# Patient Record
Sex: Female | Born: 1965 | Race: Black or African American | Hispanic: No | Marital: Married | Smoking: Never smoker
Health system: Southern US, Community
[De-identification: ages and names within clinical notes are randomized; demographics above are authoritative.]

## PROBLEM LIST (undated history)

## (undated) DIAGNOSIS — R519 Headache, unspecified: Secondary | ICD-10-CM

## (undated) DIAGNOSIS — K469 Unspecified abdominal hernia without obstruction or gangrene: Secondary | ICD-10-CM

## (undated) DIAGNOSIS — I10 Essential (primary) hypertension: Secondary | ICD-10-CM

## (undated) DIAGNOSIS — D649 Anemia, unspecified: Secondary | ICD-10-CM

## (undated) HISTORY — DX: Unspecified abdominal hernia without obstruction or gangrene: K46.9

## (undated) HISTORY — PX: BREAST REDUCTION SURGERY: SHX8

## (undated) HISTORY — PX: TUBAL LIGATION: SHX77

## (undated) HISTORY — DX: Headache, unspecified: R51.9

## (undated) HISTORY — PX: THYROIDECTOMY, PARTIAL: SHX18

---

## 2002-05-10 ENCOUNTER — Observation Stay (HOSPITAL_COMMUNITY): Admission: RE | Admit: 2002-05-10 | Discharge: 2002-05-11 | Payer: Self-pay | Admitting: Obstetrics and Gynecology

## 2002-07-02 ENCOUNTER — Ambulatory Visit (HOSPITAL_COMMUNITY): Admission: AD | Admit: 2002-07-02 | Discharge: 2002-07-02 | Payer: Self-pay | Admitting: Obstetrics and Gynecology

## 2002-07-28 ENCOUNTER — Ambulatory Visit (HOSPITAL_COMMUNITY): Admission: AD | Admit: 2002-07-28 | Discharge: 2002-07-28 | Payer: Self-pay | Admitting: Obstetrics and Gynecology

## 2002-08-03 ENCOUNTER — Ambulatory Visit (HOSPITAL_COMMUNITY): Admission: AD | Admit: 2002-08-03 | Discharge: 2002-08-03 | Payer: Self-pay | Admitting: Obstetrics and Gynecology

## 2002-09-03 ENCOUNTER — Ambulatory Visit (HOSPITAL_COMMUNITY): Admission: AD | Admit: 2002-09-03 | Discharge: 2002-09-03 | Payer: Self-pay | Admitting: Obstetrics and Gynecology

## 2002-09-24 ENCOUNTER — Ambulatory Visit (HOSPITAL_COMMUNITY): Admission: AD | Admit: 2002-09-24 | Discharge: 2002-09-24 | Payer: Self-pay | Admitting: Obstetrics and Gynecology

## 2002-10-07 ENCOUNTER — Ambulatory Visit (HOSPITAL_COMMUNITY): Admission: AD | Admit: 2002-10-07 | Discharge: 2002-10-07 | Payer: Self-pay | Admitting: Obstetrics and Gynecology

## 2002-10-12 ENCOUNTER — Inpatient Hospital Stay (HOSPITAL_COMMUNITY): Admission: AD | Admit: 2002-10-12 | Discharge: 2002-10-13 | Payer: Self-pay | Admitting: Obstetrics and Gynecology

## 2002-11-08 ENCOUNTER — Ambulatory Visit (HOSPITAL_COMMUNITY): Admission: RE | Admit: 2002-11-08 | Discharge: 2002-11-08 | Payer: Self-pay | Admitting: Obstetrics and Gynecology

## 2004-04-04 ENCOUNTER — Emergency Department (HOSPITAL_COMMUNITY): Admission: EM | Admit: 2004-04-04 | Discharge: 2004-04-05 | Payer: Self-pay | Admitting: Emergency Medicine

## 2004-05-19 ENCOUNTER — Emergency Department (HOSPITAL_COMMUNITY): Admission: EM | Admit: 2004-05-19 | Discharge: 2004-05-19 | Payer: Self-pay | Admitting: Emergency Medicine

## 2004-10-28 ENCOUNTER — Encounter (HOSPITAL_COMMUNITY): Admission: RE | Admit: 2004-10-28 | Discharge: 2004-10-29 | Payer: Self-pay | Admitting: General Surgery

## 2004-12-30 ENCOUNTER — Ambulatory Visit (HOSPITAL_COMMUNITY): Admission: RE | Admit: 2004-12-30 | Discharge: 2004-12-30 | Payer: Self-pay | Admitting: General Surgery

## 2007-01-05 ENCOUNTER — Emergency Department (HOSPITAL_COMMUNITY): Admission: EM | Admit: 2007-01-05 | Discharge: 2007-01-05 | Payer: Self-pay | Admitting: Emergency Medicine

## 2007-05-17 ENCOUNTER — Emergency Department (HOSPITAL_COMMUNITY): Admission: EM | Admit: 2007-05-17 | Discharge: 2007-05-17 | Payer: Self-pay | Admitting: Emergency Medicine

## 2007-10-19 ENCOUNTER — Emergency Department (HOSPITAL_COMMUNITY): Admission: EM | Admit: 2007-10-19 | Discharge: 2007-10-19 | Payer: Self-pay | Admitting: Emergency Medicine

## 2008-05-10 ENCOUNTER — Inpatient Hospital Stay (HOSPITAL_COMMUNITY): Admission: EM | Admit: 2008-05-10 | Discharge: 2008-05-14 | Payer: Self-pay | Admitting: Emergency Medicine

## 2008-05-17 ENCOUNTER — Other Ambulatory Visit: Admission: RE | Admit: 2008-05-17 | Discharge: 2008-05-17 | Payer: Self-pay | Admitting: Obstetrics & Gynecology

## 2010-02-08 ENCOUNTER — Emergency Department (HOSPITAL_COMMUNITY): Admission: EM | Admit: 2010-02-08 | Discharge: 2010-02-08 | Payer: Self-pay | Admitting: Emergency Medicine

## 2010-07-27 IMAGING — US US TRANSVAGINAL NON-OB
1 series · 13 of 25 positions shown · non-contrast
Comparison: None

CLINICAL DATA: Vaginal bleeding.  Anemia.

TRANSABDOMINAL AND TRANSVAGINAL ULTRASOUND OF PELVIS
TECHNIQUE: Both transabdominal and transvaginal ultrasound
examinations of the pelvis were performed including evaluation of
the uterus, ovaries, adnexal regions, and pelvic cul-de-sac.

[Series 1: us transvaginal non-ob · 0.30mm/px · 13 of 41 slices shown]
[im 1/41]
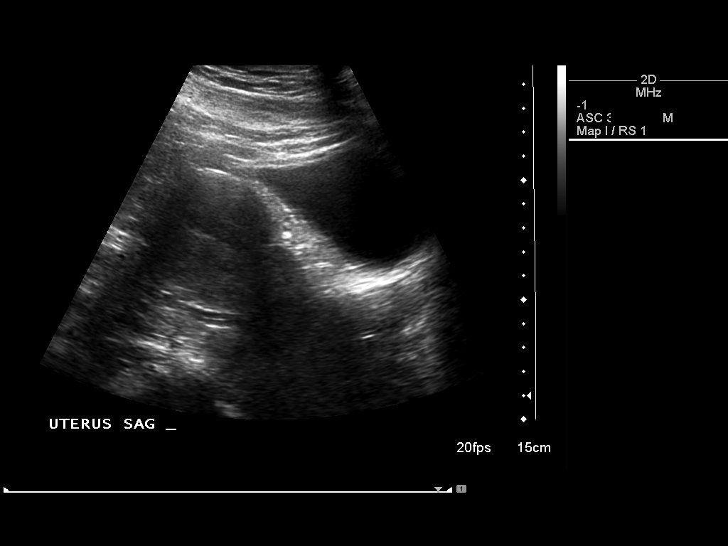
[im 4/41]
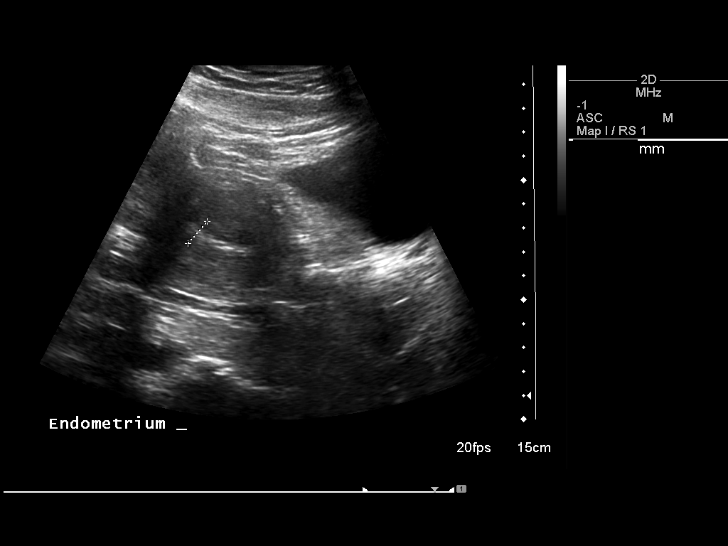
[im 7/41]
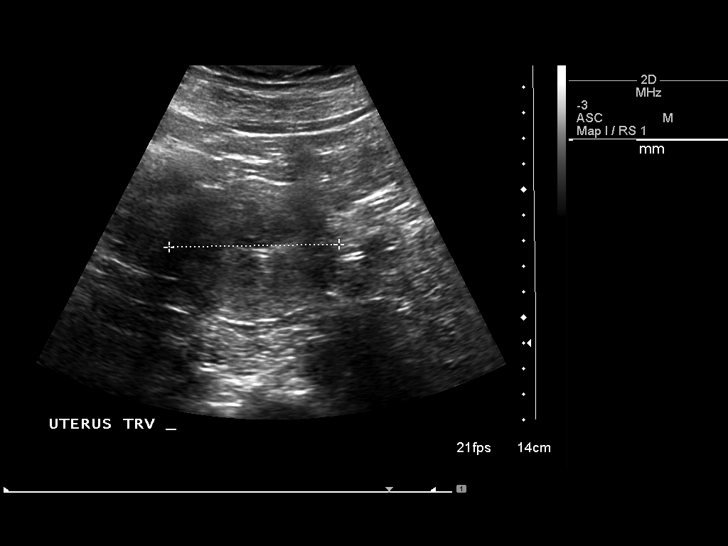
[im 11/41]
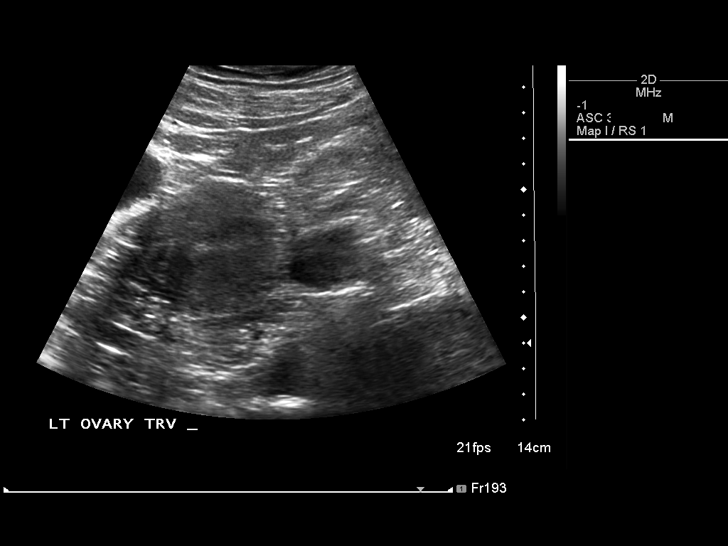
[im 14/41]
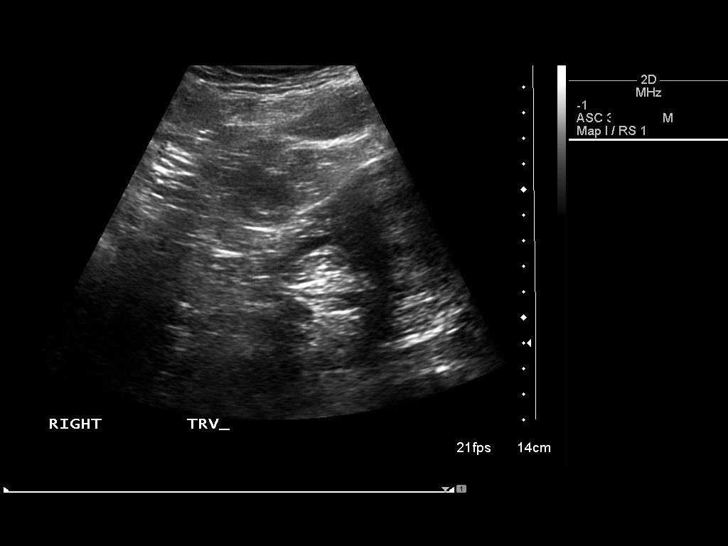
[im 17/41]
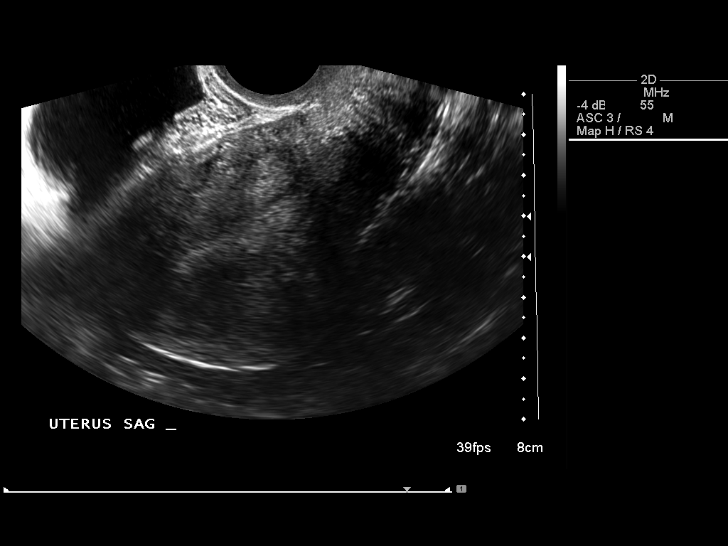
[im 21/41]
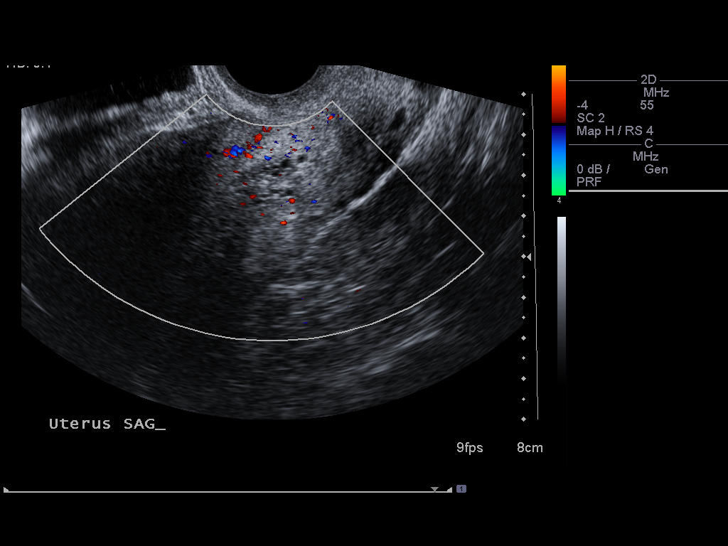
[im 24/41]
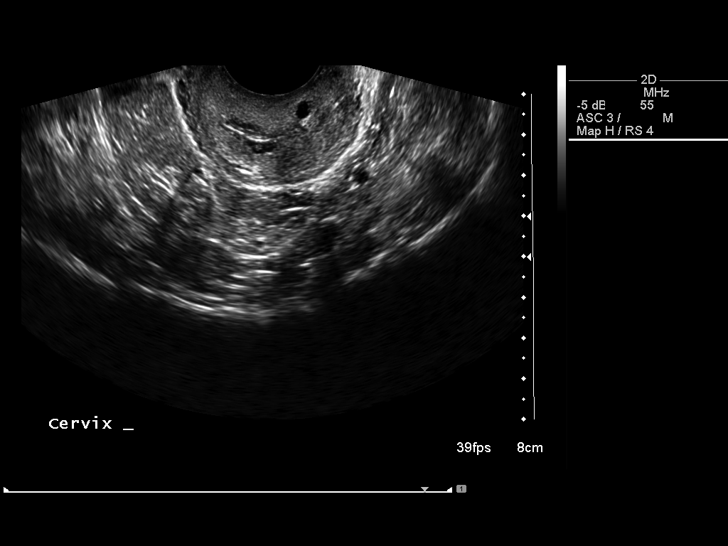
[im 27/41]
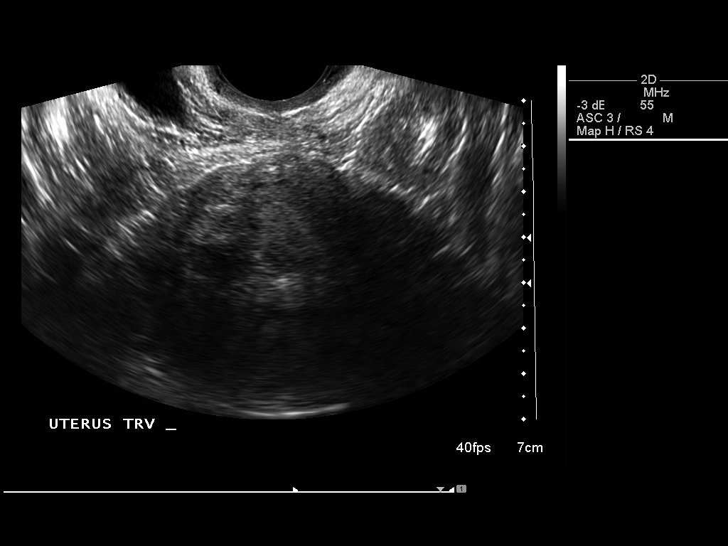
[im 31/41]
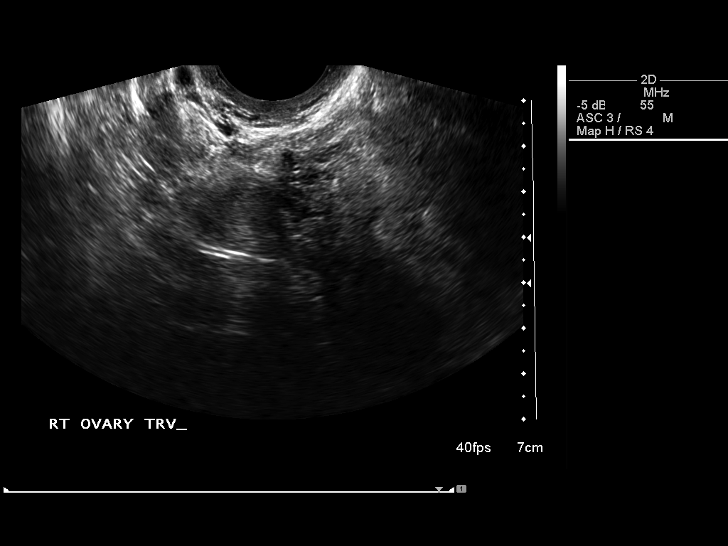
[im 34/41]
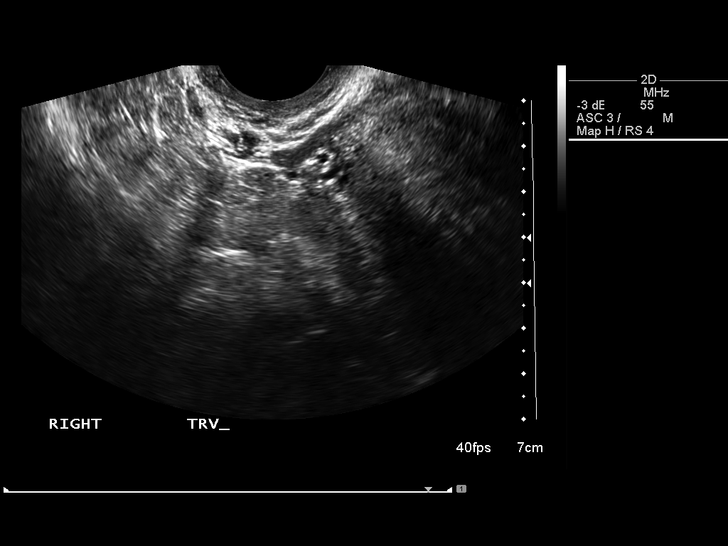
[im 37/41]
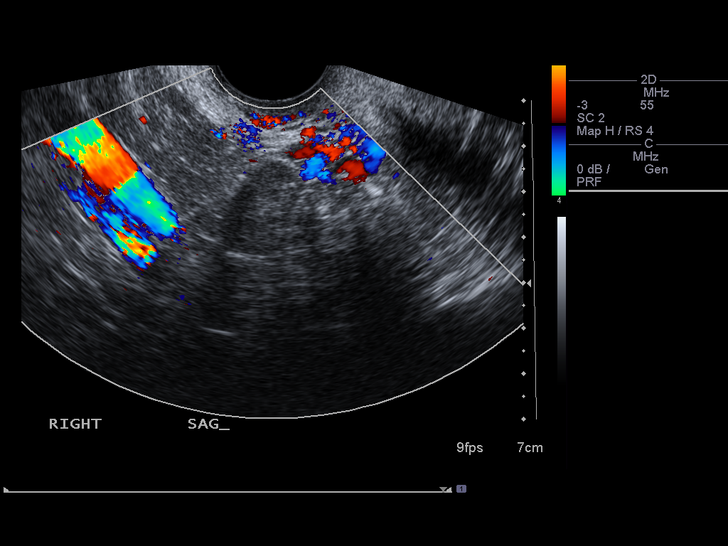
[im 41/41]
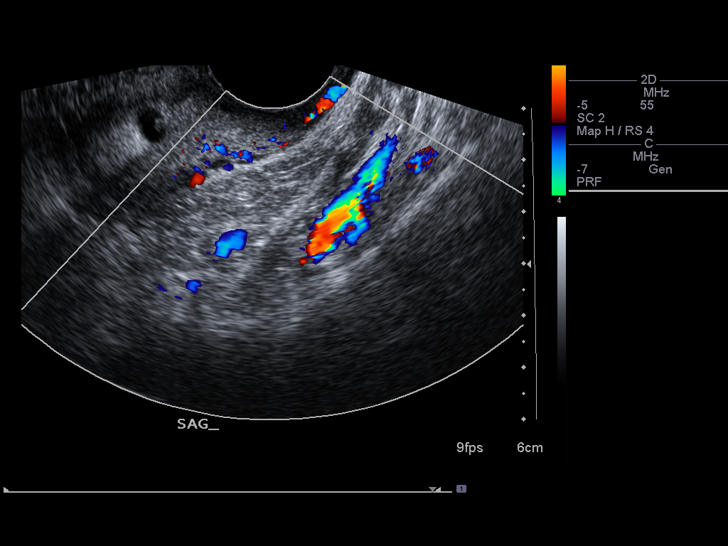

[13 of 25 positions shown; findings below may reference images not displayed]

FINDINGS: The uterus measures 12.2 cm in greatest length
transabdominally.  The endometrial stripe is 10 mm, without a focal
region of abnormal thickening. No uterine masses identified.
Several small Nabothian cysts are present in the cervix.

The right ovary measures 2.3 x 1.4 x 1.7 cm.  The left ovary
measures 4.5 x 2.6 x 2.8 cm transabdominally but was not well
visualized transvaginally. No significant abnormal free pelvic
fluid is identified.
IMPRESSION: 1.  By report the patient's last menstrual period was in 8009.  The
patient is only 42 years of age, query early menopause.  Although
the endometrial stripe measures up to 10 mm, no discrete mass is
identified within the endometrium.  If the patient is thought to be
truly post menopausal as opposed to perimenopausal, or if there is
persistent unexplained bleeding, then further workup by
sonohysterography or pelvic MRI may be warranted in order to
exclude a small endometrial lesion.
2.  Borderline prominence the left ovary transabdominally, although
the left ovary was not visualized transvaginally.

## 2010-11-19 ENCOUNTER — Other Ambulatory Visit: Payer: Self-pay | Admitting: Obstetrics & Gynecology

## 2010-11-19 ENCOUNTER — Other Ambulatory Visit (HOSPITAL_COMMUNITY)
Admission: RE | Admit: 2010-11-19 | Discharge: 2010-11-19 | Disposition: A | Discharge status: Home / Self Care | Payer: Self-pay | Source: Ambulatory Visit | Attending: Obstetrics & Gynecology | Admitting: Obstetrics & Gynecology

## 2010-11-19 DIAGNOSIS — Z01419 Encounter for gynecological examination (general) (routine) without abnormal findings: Secondary | ICD-10-CM | POA: Insufficient documentation

## 2011-01-05 LAB — GC/CHLAMYDIA PROBE AMP, GENITAL
Chlamydia, DNA Probe: NEGATIVE
GC Probe Amp, Genital: NEGATIVE

## 2011-01-05 LAB — URINALYSIS, ROUTINE W REFLEX MICROSCOPIC
Glucose, UA: 100 mg/dL — AB
Leukocytes, UA: NEGATIVE
Protein, ur: NEGATIVE mg/dL
Specific Gravity, Urine: >1.03 — ABNORMAL HIGH (ref 1.005–1.030)
pH: 5.5 (ref 5.0–8.0)

## 2011-01-05 LAB — URINE CULTURE

## 2011-01-05 LAB — URINE MICROSCOPIC-ADD ON

## 2011-01-05 LAB — WET PREP, GENITAL
Trich, Wet Prep: NONE SEEN
Yeast Wet Prep HPF POC: NONE SEEN

## 2011-03-02 NOTE — H&P (Signed)
NAMEARRIYANA, RODELL           ACCOUNT NO.:  [id number]   MEDICAL RECORD NO.:  [id number]          PATIENT TYPE:  INP   LOCATION:  A314                          FACILITY:  [facility]   PHYSICIAN:  Skeet Latch, DO    DATE OF BIRTH:  07-11-1966   DATE OF ADMISSION:  05/10/2008  DATE OF DISCHARGE:  LH                              HISTORY & PHYSICAL   PRIMARY CARE PHYSICIAN:  None.   CHIEF COMPLAINT:  Weakness and vaginal bleeding.   HISTORY OF PRESENT ILLNESS:  This is a 45 year old African American  female who presents with a 2-week history of vaginal bleeding.  The  patient states that she has a history of some vaginal bleeding in the  past, but over the last 2 weeks it has been extremely heavy.  The  patient states that over the last few days she has become extremely  weak.  The patient denies any other symptoms.  The patient's past  medical history includes thyroid disease, for which she has not taken  medication for years she states secondary to her lack of finances.  The  patient states that she has not seen a physician for quite some time.  The last physician that she saw was approximately 5 years ago and that  was a gynecologist.   PAST MEDICAL HISTORY:  Hypothyroidism and anemia.   PAST SURGICAL HISTORY:  Thyroid surgery, tubal ligation and breast  reduction.   ALLERGIES:  No known drug allergies.   MEDICATIONS:  None.   REVIEW OF SYSTEMS:  HEENT:  Unremarkable.  CARDIOVASCULAR:  Unremarkable.  GASTROINTESTINAL:  Unremarkable.  GENERAL:  Positive for  weakness.  No appetite changes.  No weight gain, weight loss problems.  SKIN:  No problems with hair loss or dry skin.  GYNECOLOGIC:  Positive  for vaginal bleeding.   PHYSICAL EXAMINATION:  GENERAL:  She is well-nourished, well-hydrated,  well-developed, in no acute distress.  HEENT:  Head is atraumatic, normocephalic.  No scleral icterus.  PERRLA.  EOMI.  NECK:  Soft, supple, nontender, nondistended.  Oral mucosa  is moist.  CARDIOVASCULAR:  Regular rate and rhythm.  No murmurs, rubs or gallops.  LUNGS:  Clear to auscultation bilaterally.  No rales, rhonchi or  wheezing.  ABDOMEN:  Soft, nontender, nondistended.  No rigidity or guarding.  Positive bowel sounds.  EXTREMITIES:  Has trace edema bilaterally.  No clubbing or cyanosis.  NEUROLOGIC:  Cranial nerves II-XII are grossly intact.  The patient is  alert and oriented x3.  GENITOURINARY:  Deferred.  The patient had exam done by the ER physician  with vaginal bleeding.   LABORATORIES:  Sodium 138, potassium 3.7, chloride 106, CO2 28, glucose  97, BUN 7, creatinine 1.09.  Reticulocyte count was 4.0, RBC 2.89, white  count 7.2, hemoglobin 6.6, hematocrit 21.1.  Her MCV is 73.0, platelet  count 411.  Urinalysis:  Trace of ketones, large amount of blood, trace  of protein, positive nitrites, negative leukocytes.  Urine  pregnancy  test was negative.   IMPRESSION:  1. Vaginal bleeding.  2. Anemia.  3. History of hypothyroidism.   PLAN:  1.  The patient will be admitted to the service of [facility], a      general medical bed.  2. For her vaginal bleeding, a gynecologic consult will be obtained.      The patient is in the process of receiving 2 units of packed red      blood cells.  We will follow her H&H every 4 to 6 hours at this      time.  3. We will obtain a vaginal ultrasound, which is pending at this time      also.  4. For her thyroid disease, we will get a TSH at this time, possibly      place the patient on thyroid replacement therapy.  5. We will await gynecological recommendations at this time.  At this      time, we will continue with supportive measures with packed red      blood cells and IV fluid hydration.      Skeet Latch, DO  Electronically Signed     SM/MEDQ  D:  05/11/2008  T:  05/11/2008  Job:  [id number]

## 2011-03-02 NOTE — Discharge Summary (Signed)
Sylvia Fisher, Sylvia Fisher           ACCOUNT NO.:  [id number]   MEDICAL RECORD NO.:  [id number]          PATIENT TYPE:  INP   LOCATION:  A314                          FACILITY:  [facility]   PHYSICIAN:  Dorris Singh, DO    DATE OF BIRTH:  January 18, 1966   DATE OF ADMISSION:  05/10/2008  DATE OF DISCHARGE:  07/28/2009LH                               DISCHARGE SUMMARY   ADMISSION DIAGNOSES:  1. Anemia.  2. Vaginal bleeding.   DISCHARGE DIAGNOSIS:  1. Anemia, which is improved.  2. Vaginal bleeding.   PRIMARY CARE PHYSICIAN:  Dr. Katrinka Blazing.   LABORATORY DATA:  Testing that was done while she was here includes a  transvaginal ultrasound and a pelvic ultrasound.  Impression of  transvaginal done on May 13, 2008, by report by the patient's last  menstrual period in 2008, the patient is only 45 years of age, clearly  early menopause although the endometrial stripe measures up to 10, no  discrete mass is identified within the endometrium.  If the patient is  thought to be truly postmenopausal, as opposed to perimenopausal or if  there is persistent unexplained bleeding, then further work up, a  sonohysterography or pelvic MRI might be warranted.  Borderline  prominence of the left ovary, transabdominal, although the left ovary  was not visualized transvaginally on the two.   HISTORY OF PRESENT ILLNESS:  Her H and P was done by Dr. Lilian Kapur.  To  summarize, the patient is a 45 year old [place] American female who  presented with a two week history of vaginal bleeding which had been  very heavy.  When she was seen in the emergency room she was found to  have a hemoglobin of 7.2.  She was then admitted to the service of  [facility].  Due to patient's antibodies in her blood; she is O positive  with antibodies, we had to wait for her transfusions to occur.  She was  transfused on May 12, 2008.  When they repeated her H and H on May 13, 2008 she was found to be anemic again so, therefore, our  service had  contacted OB over the weekend and they stated that their on-call person  could not come up to see any patient's at [facility].  We  waited until Monday and contacted the physician here on call, Dr. Despina Hidden,  who stated we should go ahead and give her Megace 120 mg daily, which we  did yesterday.  We will go ahead and discharge the patient on that as  well.  We had to transfuse her again because  her hemoglobin had dropped  on May 13, 2008 to 7.7.  We transfused two more units today.  Her  hemoglobin has remained stable.  We will go ahead and set up an  appointment with her to see Dr. Despina Hidden this week.  We will send her home  on Megace and will have her follow up with him.   DISCHARGE INSTRUCTIONS:  Her discharge instructions are to not return to  work until seen by Dr. Despina Hidden.  She can increase her activities slowly.  She  has no restrictions on her diet.  She is to stop smoking if she  smokes.  She is to take the medications as recommended.  She is to  follow up as recommended.   CONDITION ON DISCHARGE:  Stable.   DISPOSITION:  She will be discharged to home.      Dorris Singh, DO  Electronically Signed     CB/MEDQ  D:  05/14/2008  T:  05/14/2008  Job:  [id number]   cc:   Lazaro Arms, M.D.  Fax: [phone]

## 2011-03-02 NOTE — Group Therapy Note (Signed)
NAMEBELL, CARBO           ACCOUNT NO.:  [id number]   MEDICAL RECORD NO.:  [id number]          PATIENT TYPE:  INP   LOCATION:  A314                          FACILITY:  [facility]   PHYSICIAN:  Skeet Latch, DO    DATE OF BIRTH:  [birth year]   DATE OF PROCEDURE:  05/13/2008  DATE OF DISCHARGE:                                 PROGRESS NOTE   SUBJECTIVE:  Ms. Medaglia continues to have vaginal bleeding.  The  patient's weakness seems to be improving, but she states she still has  continuous flow of vaginal bleeding that is pretty steady at this point.  Overall, the patient denies any abdominal pain, chest pain or any other  complaints this morning.   OBJECTIVE:  VITAL SIGNS:  Last temperature was 98.1, pulse 85,  respirations 20, blood pressure 104/73, saturating 90% on room air.  CARDIOVASCULAR:  Regular rate and rhythm.  No murmurs, rubs or gallops.  LUNGS:  Clear to auscultation bilaterally.  No rhonchi, rales or  wheezes.  ABDOMEN:  Soft, nontender, nondistended, positive bowel sounds.  No  rigidity or guarding.  EXTREMITIES:  No cyanosis, clubbing or edema.   LABORATORY DATA:  Hemoglobin 7.7, hematocrit 24.6 .   ASSESSMENT/PLAN:  1. Dysfunctional uterine bleeding.  Will continue to follow her      hemoglobin and hematocrit.  Obtain a gynecology consult two days      prior.  Will again call gynecologic office regarding consultation      of the same.  The patient was started on Megace 40 mg b.i.d. last      night.  Will continue with IV hydration.  Also, will give the      patient one more unit of packed red blood cells.  The patient was      also started on iron supplementation yesterday.  2. Hypothyroidism.  TSH was slightly elevated.  She needs to follow up      TSH in the next few months as an outpatient.  3. For UTI, the patient is on oral antibiotics.  Will continue that at      this time.  4. Will continue supportive measures and await gynecological  consultation at this time.      Skeet Latch, DO  Electronically Signed     SM/MEDQ  D:  05/13/2008  T:  05/13/2008  Job:  [id number]

## 2011-03-05 NOTE — H&P (Signed)
   NAME:  Sylvia Fisher, Sylvia Fisher                     ACCOUNT NO.:  [id number]   MEDICAL RECORD NO.:  [id number]                   PATIENT TYPE:   LOCATION:                                       FACILITY:  [facility]   PHYSICIAN:  Tilda Burrow, M.D.              DATE OF BIRTH:  Jul 03, 1966   DATE OF ADMISSION:  DATE OF DISCHARGE:                                HISTORY & PHYSICAL   ADMITTING DIAGNOSIS:  Desire for elective sterilization.   HISTORY OF PRESENT ILLNESS:  This 45 year old female, gravida 6, para 4, AB  2, recently status post vaginal delivery on 12/26 is admitted, at this time,  for elective permanent sterilization.  This patient has confirmed her desire  for permanent sterilization with tubal sterilization request for Medicaid  signed through our office, 09/26/02.  The patient is admitted at this time  for elective permanent sterilization with Falope rings.   PAST MEDICAL HISTORY:  Benign other than history of hypothyroidism.   PAST SURGICAL HISTORY:  Reduction mammoplasty years ago, cervical  incompetence with McDonald cerclage 05/10/02.   ALLERGIES:  None.   PHYSICAL EXAMINATION:  GENERAL:  Physical exam reveals a large framed  African-American female.  VITAL SIGNS:  Height 5 feet 5 inches.  Weight 265.  Blood pressure of  149/94.  HEENT:  Pupils equal, round and reactive.  Extraocular movements are intact.  NECK:  Supple.  Trachea midline.  CHEST:  Clear to auscultation.  CARDIAC:  Regular rate and rhythm without murmurs.  ABDOMEN:  Nontender.  PELVIC:  External genitalia normal female.  Cervix mucoid discharge.  GC and  Chlamydia performed.  Most recent Pap smear class 1.  Adnexa negative for  masses, nontender.  Uterus anteflexed.  Difficult to feel due to abdominal  girth, but seemingly within normal limits and nontender.  EXTREMITIES:  Extremities are grossly normal.   PLAN:  Laparoscopic tubal sterilization Falope rings on 11/08/01.                         Tilda Burrow, M.D.    JVF/MEDQ  D:  11/07/2002  T:  11/07/2002  Job:  [id number]

## 2011-03-05 NOTE — H&P (Signed)
Sylvia Fisher, Sylvia Fisher                       ACCOUNT NO.:  [id number]   MEDICAL RECORD NO.:  [id number]                  PATIENT TYPE:   LOCATION:                                       FACILITY:   PHYSICIAN:  Tilda Burrow, M.D.              DATE OF BIRTH:   DATE OF ADMISSION:  10/08/2002  DATE OF DISCHARGE:                                HISTORY & PHYSICAL   ADMITTING DIAGNOSES:  1. Pregnancy 38-1/[redacted] weeks gestation.  2. Advanced cervical favorability.  3. Marked pelvic pressure.  4. Elective induction for social reasons.   HISTORY OF PRESENT ILLNESS:  This 45 year old female, gravida 6, para 3, AB  2, LMP ? with ultrasound assigned EDC, based on 7-week ultrasound, confirmed  again at 10 weeks and at 19 weeks.  The baby is growing at a generous rate  and is slightly large for gestational age measuring 32 weeks 2 days on  10/31, suggesting advancing of the LMP to 10/11/02, indicating large fetal  growth.   ASSESSMENT AND PLAN:  The patient is admitted at this time for induction of  labor after multiple visits for pelvic pressure and discomfort.  She has a  history of cervical incompetency and had a 2-stich McDonald cerclage placed  05/10/02 removed 09/26/02.  She has been seen several times since then for  evaluation of pressure discomfort.  Cervix has changed to 3 cm long, soft,  minus 2 station, vertex presentation confirmed.  She has been seen again in  labor and delivery last night.  The option of induction is vigorously  requested by the patient.  She is aware of all the usual risks of labor  management which can occur with induced deliveries.  This specifically  includes the need for emergent intervention and cesarean delivery.  The  patient desires induction and wants tubal ligation if emergency delivery  required.   PAST MEDICAL HISTORY:  Hypothyroidism stable this pregnancy.   PAST SURGICAL HISTORY:  Breast reduction mammoplasty many years ago.   SOCIAL  HISTORY:  Habits negative for cigarettes, alcohol, or recreational  drugs.   ALLERGIES:  Negative.   PHYSICAL EXAMINATION:  VITAL SIGNS:  Height 5 feet 8 inches, weight 272.  Blood pressure 132/72.  GENERAL:  Exam shows a healthy, exhausted appearing African-American female  alert and oriented x3.  HEENT:  Pupils are equal, round, and reactive.  NECK:  Supple.  ABDOMEN:  Nontender.  Gravid uterus 38 cm.  Estimated fetal weight 8-1/2  pounds.  Cervix 3, long, -2, vertex soft.   PLAN:  Pitocin induction on 10/12/02.   ADDENDUM LABORATORY DATA:  Blood type O positive.  Hemoglobin 13, hematocrit  42.  Hepatitis, HIV, GC, Chlamydia, and RPR all negative.  Rubella immunity  present.  Sickle index negative.   DISPOSITION:  The patient plans to bottle feed and take the baby to [facility] and plans tubal ligation.  Tilda Burrow, M.D.    JVF/MEDQ  D:  10/08/2002  T:  10/08/2002  Job:  [id number]   cc:   [facility]

## 2011-03-05 NOTE — Op Note (Signed)
NAME:  Sylvia Fisher, Sylvia Fisher                     ACCOUNT NO.:  [id number]   MEDICAL RECORD NO.:  [id number]                   PATIENT TYPE:  AMB   LOCATION:  DAY                                  FACILITY:  [facility]   PHYSICIAN:  Tilda Burrow, M.D.              DATE OF BIRTH:  July 21, 1966   DATE OF PROCEDURE:  DATE OF DISCHARGE:                                 OPERATIVE REPORT   PREOPERATIVE DIAGNOSIS:  Elective sterilization.   POSTOPERATIVE DIAGNOSIS:  Elective sterilization.   PROCEDURE:  Laparoscopic tubal sterilization with Falope rings.   SURGEON:  Christin Bach, M.D.   ASSISTANT:  Damian Leavell, CST   ANESTHESIA:  General   COMPLICATIONS:  None.   FINDINGS:  Relaxed, mobile uterus, cervix and abdominal wall due to  postpartum status.  Normal appearing tubes and ovaries bilaterally, small  fallopian tubes easily brought into Falope ring applier.   INDICATION:  Elective permanent sterilization.   DETAILS OF PROCEDURE:  The patient was taken to the operating room, prepped  and  draped for a combined abdominal and vaginal procedure, with Hulka  tenaculum attached to the cervix for uterine  manipulation. Bladder in-and-  out catheterization. An infraumbilical, 1 cm vertical incision, as well as a  transverse suprapubic 1 cm incision. Veress needle was used to introduce  pneumoperitoneum through the umbilical incision with the pneumoperitoneum  easily introduced under 10 mmHg of pressure. Introduction of the Veress  needle was done, carefully elevating the abdominal wall and orienting the  needle toward the pelvis.   The laparoscopic trocar was then carefully introduced into the abdomen using  a similar technique, and the laparoscope was used to visualize normal pelvic  anatomy with no evidence of bleeding or trauma. The suprapubic trocar was  introduced under direct visualization, and then attention was directed to  the left fallopian tube, which was identified up to its  fimbriated end,  elevated and a mid-segment loop of the tube was drawn up into the Falope  ring applier, Marcaine 0.25% applied to the surface of the tube and the  Falope ring applied, inspected, and found to be in satisfactory position.  The opposite tube was then treated in a similar fashion. The mesosalpinx  beneath the Falope ring on each side was then infiltrated with approximately  3 cc of Marcaine 0.25%, using a transabdominal approach with a 22-gauge  spinal needle. Then the laparoscopic equipment was removed after instilling  200 cc of saline into the abdomen and deflating the abdomen. Subcuticular 4-  0 Dexon was used to close the skin and Steri-Strips were placed on the skin  surface. Sponge and needle counts were correct. The patient tolerated the  procedure well, was awakened, and went to the recovery room in good  condition.  Tilda Burrow, M.D.    JVF/MEDQ  D:  11/08/2002  T:  11/08/2002  Job:  [id number]

## 2011-03-05 NOTE — Op Note (Signed)
   NAME:  KARISHMA, UNREIN                     ACCOUNT NO.:  [id number]   MEDICAL RECORD NO.:  [id number]                   PATIENT TYPE:  INP   LOCATION:  A418                                 FACILITY:  [facility]   PHYSICIAN:  Tilda Burrow, M.D.              DATE OF BIRTH:  07/03/1966   DATE OF PROCEDURE:  DATE OF DISCHARGE:                                 OPERATIVE REPORT   DELIVERY NOTE:  The patient was noted to be fully dilated with a strong urge  to push at 1250.  After a 30 minute second stage of effective pushing with  little descent the patient was noted to be in ROP position.  However, due to  the length of second stage for a para 3, the fact that her pelvis is proven  for a 5 pound infant and bradycardia in the 60s, Dr. Duane Lope was called  to attend the delivery at approximately 1315.   Upon arrival to the unit Dr. Despina Hidden helped expedite the rotation of the baby  using manual traction.  The baby rotated nicely and was delivered at 1327.  There was a loose nuchal cord which was easily reduced.  The shoulders were  then delivered after a compound right posterior arm was delivered.  The tone  of the infant was very poor at delivery and the cord was doubly clamped and  cut and the infant immediately taken to the radiant warmer.  Apgars were 4  and 9.  Please see nursery for resuscitation efforts.  The weight was 7  pounds 7 ounces.   The placenta separated spontaneously and was delivered by a controlled cord  traction at 1334.  It was inspected and appears to be intact with a 3-vessel  cord.  Twenty units of Pitocin diluted in 1000 cc of lactated Ringers was  then infused rapidly IV.  The fundus was immediately firm and minimal blood  loss was noted.  The vagina was then inspected and no lacerations were  found.  Estimated blood loss 300 cc.   ADDENDUM: The bladder was emptied using a red Robinson catheter at  approximately 1300.     Jacklyn Shell, C.N.M.           Tilda Burrow, M.D.    FC/MEDQ  D:  10/12/2002  T:  10/12/2002  Job:  [id number]   cc:   [facility]

## 2011-03-05 NOTE — Op Note (Signed)
[facility]  Patient:    Sylvia Fisher, THATCH Visit Number: [id number] MRN: [id number]          Service Type: OBV Location: 4A A418 01 Attending Physician:  Tilda Burrow Dictated by:   Christin Bach, M.D. Proc. Date: 05/10/02 Admit Date:  05/10/2002 Discharge Date: 05/11/2002                             Operative Report  PREOPERATIVE DIAGNOSES:       1. Pregnancy at [redacted] weeks gestation.                               2. History of cervical incompetence x 2.  POSTOPERATIVE DIAGNOSES:      1. Pregnancy at [redacted] weeks gestation.                               2. History of cervical incompetence x 2.  OPERATION:                    McDonald cerclage, two stitch technique.  SURGEON:                      Christin Bach, M.D.  ASSISTANT:                    Cathie Beams, C.N.M.  ANESTHESIA:                   Spinal, Nelda Severe, C.R.N.A.  COMPLICATIONS:                None.  FINDINGS:                     Large bulb of cervix without dilation at this time.  DETAILS OF PROCEDURE:         The patient was taken to the operating room, prepped and draped for vaginal procedure with legs supported in high lithotomy position with yellow fin leg supports after spinal anesthesia had been obtained using a saddle block technique and allowing the patient to sit up for 3 minutes after spinal anesthetics injected.  In and out catheterization of the bladder emptied the bladder, and a large bivalve Graves speculum inserted into the vagina.  Deaver retractor could be slipped to the side of the speculum to assist in visualization.  The large bulb of the cervix was easily identified, grasped on its anterior lip with Allis clamp, and a 0 Prolene circumferential stitch placed just at the level of the cervicovaginal fornix.  This was placed in a clockwise fashion and tied down at 12 oclock.  Five knots were used.  The cervix was distinctly smaller at the level of the stitch  once the procedure was completed.  A second stitch was placed approximately 1 cm inferior to the initial stitch and also tied down with good results.  The patient tolerated the procedure well, with 25 cc blood loss or less, and went to the recovery room in good condition.  The patient will be observed overnight. Dictated by:   Christin Bach, M.D. Attending Physician:  Tilda Burrow DD:  05/10/02 TD:  05/14/02 Job: [id number]

## 2011-03-05 NOTE — Op Note (Signed)
   Sylvia Fisher, Sylvia Fisher                     ACCOUNT NO.:  [id number]   MEDICAL RECORD NO.:  [id number]                   PATIENT TYPE:  OIB   LOCATION:  A415                                 FACILITY:  [facility]   PHYSICIAN:  Tilda Burrow, M.D.              DATE OF BIRTH:  [birth year]   DATE OF PROCEDURE:  DATE OF DISCHARGE:                                 OPERATIVE REPORT   OPERATION PERFORMED:  External cephalic version.   SURGEON:  Tilda Burrow, M.D.   INDICATIONS FOR PROCEDURE:  The patient is a 45 year old gravida 6, para 3,  ab2 at [redacted] weeks gestation with pregnancy notable for persistent breech  presentation and with a history of cervical incompetence through two prior  pregnancies.  She has a [employer] cerclage in place with two stitches.  Blood type is O+.   DESCRIPTION OF PROCEDURE:  The patient was placed on monitor and reactive  NST obtained.  Ultrasound was performed by me revealing a singleton breach  infant with the vertex in the midline, face up with the fetal spine to the  right of the midline with frank breach presentation.  The amniotic fluid was  within normal limits.  She received a single dose of  Terbutaline subcu with  excellent relaxation.  Fetal monitoring had shown fetal well being and a  reactive pattern obtained.   Forward roll technique was used over a period of approximately four minutes  with easy forward roll rotation of the infant, resulting in vertex  presentation.  Ultrasound confirmation of fetal heart normality was  performed.  We then re-established the external monitoring and it showed  continued reactive pattern.  The patient will be returning to our office in  two days for cerclage removal.  Again, the patient is blood type O+.                                                Tilda Burrow, M.D.    JVF/MEDQ  D:  09/24/2002  T:  09/24/2002  Job:  [id number]

## 2011-07-16 LAB — CROSSMATCH
ABO/RH(D): O POS
Antibody Screen: POSITIVE
DAT, IgG: NEGATIVE
Donor AG Type: NEGATIVE
PT AG Type: NEGATIVE

## 2011-07-16 LAB — BASIC METABOLIC PANEL
BUN: 7
BUN: 8
CO2: 25
Chloride: 106
Chloride: 107
Creatinine, Ser: 1.28 — ABNORMAL HIGH
GFR calc non Af Amer: 56 — ABNORMAL LOW
Glucose, Bld: 93
Glucose, Bld: 97
Potassium: 3.7
Potassium: 3.8
Sodium: 138

## 2011-07-16 LAB — RETICULOCYTES
RBC.: 2.89 — ABNORMAL LOW
Retic Count, Absolute: 115.6
Retic Ct Pct: 4 — ABNORMAL HIGH

## 2011-07-16 LAB — CBC
HCT: 21.1 — ABNORMAL LOW
HCT: 22.9 — ABNORMAL LOW
Hemoglobin: 6.6 — CL
MCV: 72.2 — ABNORMAL LOW
MCV: 73 — ABNORMAL LOW
Platelets: 411 — ABNORMAL HIGH
Platelets: 477 — ABNORMAL HIGH
RBC: 3.18 — ABNORMAL LOW
RDW: 18.9 — ABNORMAL HIGH
WBC: 7.2
WBC: 9.9

## 2011-07-16 LAB — ABO/RH: ABO/RH(D): O POS

## 2011-07-16 LAB — HEMOGLOBIN AND HEMATOCRIT, BLOOD
HCT: 24.6 — ABNORMAL LOW
HCT: 25.3 — ABNORMAL LOW
HCT: 26.5 — ABNORMAL LOW
HCT: 28.3 — ABNORMAL LOW
HCT: 30.1 — ABNORMAL LOW
Hemoglobin: 7.7 — CL
Hemoglobin: 8 — ABNORMAL LOW
Hemoglobin: 8.1 — ABNORMAL LOW
Hemoglobin: 8.3 — ABNORMAL LOW
Hemoglobin: 8.4 — ABNORMAL LOW
Hemoglobin: 9.8 — ABNORMAL LOW
Hemoglobin: 9.9 — ABNORMAL LOW

## 2011-07-16 LAB — DIFFERENTIAL
Eosinophils Absolute: 0.1
Eosinophils Relative: 1
Lymphocytes Relative: 25
Lymphocytes Relative: 29
Lymphs Abs: 2.1
Lymphs Abs: 2.5
Monocytes Absolute: 0.6
Monocytes Relative: 8
Neutrophils Relative %: 66

## 2011-07-16 LAB — IRON AND TIBC
Iron: 10 — ABNORMAL LOW
UIBC: 304

## 2011-07-16 LAB — POCT I-STAT, CHEM 8
BUN: 8
Chloride: 104
HCT: 24 — ABNORMAL LOW
Potassium: 3.4 — ABNORMAL LOW
Sodium: 138

## 2011-07-16 LAB — GC/CHLAMYDIA PROBE AMP, GENITAL
Chlamydia, DNA Probe: NEGATIVE
GC Probe Amp, Genital: NEGATIVE

## 2011-07-16 LAB — URINALYSIS, ROUTINE W REFLEX MICROSCOPIC
Bilirubin Urine: NEGATIVE
Glucose, UA: NEGATIVE
Urobilinogen, UA: 2 — ABNORMAL HIGH

## 2011-07-16 LAB — FERRITIN: Ferritin: 3 — ABNORMAL LOW (ref 10–291)

## 2011-07-16 LAB — URINE CULTURE: Colony Count: 25000

## 2011-07-16 LAB — URINE MICROSCOPIC-ADD ON

## 2011-07-16 LAB — TSH: TSH: 4.597 — ABNORMAL HIGH

## 2011-08-02 LAB — DIFFERENTIAL
Basophils Absolute: 0.1
Eosinophils Absolute: 0.1
Eosinophils Relative: 1
Lymphocytes Relative: 26
Neutrophils Relative %: 65

## 2011-08-02 LAB — POCT CARDIAC MARKERS
Operator id: 265261
Troponin i, poc: <0.05

## 2011-08-02 LAB — BASIC METABOLIC PANEL
BUN: 3 — ABNORMAL LOW
Creatinine, Ser: 0.91
GFR calc non Af Amer: >60
Glucose, Bld: 84
Potassium: 3.6

## 2011-08-02 LAB — CBC
HCT: 25.5 — ABNORMAL LOW
MCV: 70.6 — ABNORMAL LOW
Platelets: 482 — ABNORMAL HIGH
RDW: 19.2 — ABNORMAL HIGH

## 2016-01-20 ENCOUNTER — Emergency Department (HOSPITAL_COMMUNITY)
Admission: EM | Admit: 2016-01-20 | Discharge: 2016-01-21 | Disposition: A | Discharge status: Home / Self Care | Payer: Self-pay | Attending: Emergency Medicine | Admitting: Emergency Medicine

## 2016-01-20 ENCOUNTER — Encounter (HOSPITAL_COMMUNITY): Payer: Self-pay | Admitting: Emergency Medicine

## 2016-01-20 DIAGNOSIS — G43009 Migraine without aura, not intractable, without status migrainosus: Secondary | ICD-10-CM | POA: Insufficient documentation

## 2016-01-20 DIAGNOSIS — I10 Essential (primary) hypertension: Secondary | ICD-10-CM | POA: Insufficient documentation

## 2016-01-20 DIAGNOSIS — Z791 Long term (current) use of non-steroidal anti-inflammatories (NSAID): Secondary | ICD-10-CM | POA: Insufficient documentation

## 2016-01-20 HISTORY — DX: Anemia, unspecified: D64.9

## 2016-01-20 HISTORY — DX: Essential (primary) hypertension: I10

## 2016-01-20 MED ORDER — METOCLOPRAMIDE HCL 5 MG/ML IJ SOLN
10.0000 mg | Freq: Once | INTRAMUSCULAR | Status: AC
  Administered 2016-01-21: 10 mg via INTRAVENOUS
  Filled 2016-01-20: qty 2

## 2016-01-20 MED ORDER — DIPHENHYDRAMINE HCL 50 MG/ML IJ SOLN
25.0000 mg | Freq: Once | INTRAMUSCULAR | Status: AC
  Administered 2016-01-21: 25 mg via INTRAVENOUS
  Filled 2016-01-20: qty 1

## 2016-01-20 MED ORDER — SODIUM CHLORIDE 0.9 % IV BOLUS (SEPSIS)
1000.0000 mL | Freq: Once | INTRAVENOUS | Status: AC
  Administered 2016-01-21: 1000 mL via INTRAVENOUS

## 2016-01-20 MED ORDER — DEXAMETHASONE SODIUM PHOSPHATE 10 MG/ML IJ SOLN
10.0000 mg | Freq: Once | INTRAMUSCULAR | Status: AC
  Administered 2016-01-21: 10 mg via INTRAVENOUS
  Filled 2016-01-20: qty 1

## 2016-01-20 NOTE — ED Provider Notes (Signed)
CSN: [id number]     Arrival date & time 01/20/16  2146 History  By signing my name below, I, Linna Darner, attest that this documentation has been prepared under the direction and in the presence of physician practitioner, Devoria Albe, MD at 2337. Electronically Signed: Linna Darner, Scribe. 01/20/2016. 11:40 PM.     Chief Complaint  Patient presents with  . Numbness    The history is provided by the patient. No language interpreter was used.     HPI Comments: Sylvia Fisher is a 50 y.o. female with h/o HTN, headaches, and anemia who presents to the Emergency Department complaining of sudden onset, constant, severe, sharp, dull, throbbing, bilateral frontal headache since approximately 8PM tonight. Pt has been experiencing daily headaches for years. She endorses nausea and vomiting for the last two weeks x3-4 daily. She also notes significant epigastric pain due to a hernia; her hernia is out currently and she tried to push it in PTA with  success.  She also notes associated subjective fever beginning four hours ago because she felt hot inside. Pt states that she experiences headaches every day and experiences severe headaches every night before bed. Pt states that she usually takes Advil for headaches but has not taken any today. She endorses that her bilateral fingertips became numb when her migraine presented tonight and she also experienced blurry vision. This is what prompted her ED visit. Pt does not smoke or drink. She is unemployed. Pt has never seen a neurologist for her headaches. She does not have a PCP. She does not believe her headaches are related to caffeine or any specific activity. Headaches do not run in her family to her knowledge. She denies photophobia, diarrhea, or any other associated symptoms.  PCP none Neurology none  Past Medical History  Diagnosis Date  . Hypertension   . Anemia    Past Surgical History  Procedure Laterality Date  . Tubal ligation    . Breast  reduction surgery     No family history on file. Social History  Substance Use Topics  . Smoking status: Never Smoker   . Smokeless tobacco: Never Used  . Alcohol Use: No   Unemployed Lives with spouse  OB History    No data available     Review of Systems  Eyes: Positive for visual disturbance. Negative for photophobia.  Gastrointestinal: Positive for nausea, vomiting and abdominal pain. Negative for diarrhea.  Neurological: Positive for numbness and headaches.  All other systems reviewed and are negative.   Allergies  Review of patient's allergies indicates no known allergies.  Home Medications   Prior to Admission medications   Medication Sig Start Date End Date Taking? Authorizing Provider  ibuprofen (ADVIL,MOTRIN) 200 MG tablet Take 200 mg by mouth every 6 (six) hours as needed for mild pain or moderate pain.   Yes Historical Provider, MD   BP 147/78 mmHg  Pulse 77  Temp(Src) 97.9 F (36.6 C) (Oral)  Resp 22  Ht  (1.651 m)  Wt 250 lb (113.399 kg)  BMI 41.60 kg/m2  SpO2 100%  LMP 11/19/2015 (Approximate)  Vital signs normal   Physical Exam  Constitutional: She is oriented to person, place, and time. She appears well-developed and well-nourished.  Non-toxic appearance. She does not appear ill. No distress.  Holding her head, groaning  HENT:  Head: Normocephalic and atraumatic.  Right Ear: External ear normal.  Left Ear: External ear normal.  Nose: Nose normal. No mucosal edema or rhinorrhea.  Mouth/Throat: Oropharynx is clear and moist and mucous membranes are normal. No dental abscesses or uvula swelling.  Eyes: Conjunctivae and EOM are normal. Pupils are equal, round, and reactive to light.  Neck: Normal range of motion and full passive range of motion without pain. Neck supple.  Cardiovascular: Normal rate, regular rhythm and normal heart sounds.  Exam reveals no gallop and no friction rub.   No murmur heard. Pulmonary/Chest: Effort normal and  breath sounds normal. No respiratory distress. She has no wheezes. She has no rhonchi. She has no rales. She exhibits no tenderness and no crepitus.  Abdominal: Soft. Normal appearance and bowel sounds are normal. She exhibits no distension. There is no tenderness. There is no rebound and no guarding.  Musculoskeletal: Normal range of motion. She exhibits no edema or tenderness.  Moves all extremities well.   Neurological: She is alert and oriented to person, place, and time. She has normal strength. No cranial nerve deficit.  Skin: Skin is warm, dry and intact. No rash noted. No erythema. No pallor.  Psychiatric: She has a normal mood and affect. Her speech is normal and behavior is normal. Her mood appears not anxious.  Nursing note and vitals reviewed.   ED Course  Procedures (including critical care time) Medications  sodium chloride 0.9 % bolus 1,000 mL (0 mLs Intravenous Stopped 01/21/16 0047)  metoCLOPramide (REGLAN) injection 10 mg (10 mg Intravenous Given 01/21/16 0002)  diphenhydrAMINE (BENADRYL) injection 25 mg (25 mg Intravenous Given 01/21/16 0001)  dexamethasone (DECADRON) injection 10 mg (10 mg Intravenous Given 01/21/16 0002)     DIAGNOSTIC STUDIES: Oxygen Saturation is 100% on RA, normal by my interpretation.    COORDINATION OF CARE: 12:16 AM Discussed treatment plan with pt at bedside and pt agreed to plan. Patient was started on IV migraine cocktail medications for her headache.  Recheck at 1:30 AM patient states her headache is much improved and is back down to a regular headache. At this point she feels ready to be discharged home. She was given referral for neurologist to evaluate her for her daily headaches.      MDM   Final diagnoses:  Migraine without aura and without status migrainosus, not intractable   Plan discharge  Devoria AlbeIva Dottie Vaquerano, MD, FACEP    I personally performed the services described in this documentation, which was scribed in my presence. The  recorded information has been reviewed and considered.  Devoria AlbeIva Elishua Radford, MD, Concha PyoFACEP   Adaya Garmany, MD 01/21/16 [phone]

## 2016-01-20 NOTE — ED Notes (Signed)
MD at bedside. 

## 2016-01-20 NOTE — ED Notes (Signed)
Pt states that 2-3 hours PTA shea started having numbness and tingling in both hands and fingers and having tingling on left side of face.  Ambulated to triage, symmetrical face, no slurred speech.

## 2016-01-21 LAB — BASIC METABOLIC PANEL
Anion gap: 9 (ref 5–15)
BUN: 8 mg/dL (ref 6–20)
CHLORIDE: 106 mmol/L (ref 101–111)
CO2: 22 mmol/L (ref 22–32)
CREATININE: 0.97 mg/dL (ref 0.44–1.00)
Calcium: 7.9 mg/dL — ABNORMAL LOW (ref 8.9–10.3)
GFR calc non Af Amer: >60 mL/min (ref >60)
Glucose, Bld: 107 mg/dL — ABNORMAL HIGH (ref 65–99)
POTASSIUM: 3.2 mmol/L — AB (ref 3.5–5.1)
Sodium: 137 mmol/L (ref 135–145)

## 2016-01-21 NOTE — ED Notes (Signed)
Pt with strong cough, clear mucous, making her gag. Comfort measures.

## 2016-01-21 NOTE — ED Notes (Signed)
EDP made aware pt pulled out her iv (states that she didn't know she pulled it out) MD at bedside for reevaluation.

## 2016-01-21 NOTE — Discharge Instructions (Signed)
Go home and rest. You should consider seeing a neurologist about your frequent headaches. You can see Dr Gerilyn Pilgrimoonquah here in [place] ( phone #[phone] at [address]) or a gave you the phone numbers for two neurologist groups in [place].   Recurrent Migraine Headache A migraine headache is an intense, throbbing pain on one or both sides of your head. Recurrent migraines keep coming back. A migraine can last for 30 minutes to several hours. CAUSES  The exact cause of a migraine headache is not always known. However, a migraine may be caused when nerves in the brain become irritated and release chemicals that cause inflammation. This causes pain. Certain things may also trigger migraines, such as:   Alcohol.  Smoking.  Stress.  Menstruation.  Aged cheeses.  Foods or drinks that contain nitrates, glutamate, aspartame, or tyramine.  Lack of sleep.  Chocolate.  Caffeine.  Hunger.  Physical exertion.  Fatigue.  Medicines used to treat chest pain (nitroglycerine), birth control pills, estrogen, and some blood pressure medicines. SYMPTOMS   Pain on one or both sides of your head.  Pulsating or throbbing pain.  Severe pain that prevents daily activities.  Pain that is aggravated by any physical activity.  Nausea, vomiting, or both.  Dizziness.  Pain with exposure to bright lights, loud noises, or activity.  General sensitivity to bright lights, loud noises, or smells. Before you get a migraine, you may get warning signs that a migraine is coming (aura). An aura may include:  Seeing flashing lights.  Seeing bright spots, halos, or zigzag lines.  Having tunnel vision or blurred vision.  Having feelings of numbness or tingling.  Having trouble talking.  Having muscle weakness. DIAGNOSIS  A recurrent migraine headache is often diagnosed based on:  Symptoms.  Physical examination.  A CT scan or MRI of your head. These imaging tests cannot  diagnose migraines but can help rule out other causes of headaches.  TREATMENT  Medicines may be given for pain and nausea. Medicines can also be given to help prevent recurrent migraines. HOME CARE INSTRUCTIONS  Only take over-the-counter or prescription medicines for pain or discomfort as directed by your health care provider. The use of long-term narcotics is not recommended.  Lie down in a dark, quiet room when you have a migraine.  Keep a journal to find out what may trigger your migraine headaches. For example, write down:  What you eat and drink.  How much sleep you get.  Any change to your diet or medicines.  Limit alcohol consumption.  Quit smoking if you smoke.  Get 7-9 hours of sleep, or as recommended by your health care provider.  Limit stress.  Keep lights dim if bright lights bother you and make your migraines worse. SEEK MEDICAL CARE IF:   You do not get relief from the medicines given to you.  You have a recurrence of pain.  You have a fever. SEEK IMMEDIATE MEDICAL CARE IF:  Your migraine becomes severe.  You have a stiff neck.  You have loss of vision.  You have muscular weakness or loss of muscle control.  You start losing your balance or have trouble walking.  You feel faint or pass out.  You have severe symptoms that are different from your first symptoms. MAKE SURE YOU:   Understand these instructions.  Will watch your condition.  Will get help right away if you are not doing well or get worse.   This information is not intended to replace  advice given to you by your health care provider. Make sure you discuss any questions you have with your health care provider.   Document Released: 06/29/2001 Document Revised: 10/25/2014 Document Reviewed: 06/11/2013 Elsevier Interactive Patient Education [employer]

## 2016-02-13 ENCOUNTER — Ambulatory Visit: Payer: Self-pay | Admitting: Neurology

## 2016-02-18 ENCOUNTER — Ambulatory Visit: Payer: Self-pay | Admitting: Neurology

## 2016-03-04 ENCOUNTER — Ambulatory Visit: Payer: Self-pay | Admitting: Neurology

## 2019-12-04 ENCOUNTER — Ambulatory Visit: Payer: Self-pay | Admitting: General Surgery

## 2022-11-17 ENCOUNTER — Ambulatory Visit (INDEPENDENT_AMBULATORY_CARE_PROVIDER_SITE_OTHER): Payer: Medicaid Other

## 2022-11-17 ENCOUNTER — Ambulatory Visit
Admission: EM | Admit: 2022-11-17 | Discharge: 2022-11-17 | Disposition: A | Discharge status: Home / Self Care | Payer: Medicaid Other | Attending: Family Medicine | Admitting: Family Medicine

## 2022-11-17 DIAGNOSIS — M79644 Pain in right finger(s): Secondary | ICD-10-CM | POA: Diagnosis not present

## 2022-11-17 DIAGNOSIS — Z23 Encounter for immunization: Secondary | ICD-10-CM

## 2022-11-17 DIAGNOSIS — S6710XA Crushing injury of unspecified finger(s), initial encounter: Secondary | ICD-10-CM | POA: Diagnosis not present

## 2022-11-17 DIAGNOSIS — S61216A Laceration without foreign body of right little finger without damage to nail, initial encounter: Secondary | ICD-10-CM | POA: Diagnosis not present

## 2022-11-17 MED ORDER — TETANUS-DIPHTH-ACELL PERTUSSIS 5-2.5-18.5 LF-MCG/0.5 IM SUSY
0.5000 mL | PREFILLED_SYRINGE | Freq: Once | INTRAMUSCULAR | Status: AC
  Administered 2022-11-17: 0.5 mL via INTRAMUSCULAR

## 2022-11-17 MED ORDER — CHLORHEXIDINE GLUCONATE 4 % EX LIQD: Freq: Every day | CUTANEOUS | 0 refills | Status: DC | PRN

## 2022-11-17 MED ORDER — MUPIROCIN 2 % EX OINT: 1.0000 | TOPICAL_OINTMENT | Freq: Two times a day (BID) | CUTANEOUS | 0 refills | Status: DC

## 2022-11-17 NOTE — ED Provider Notes (Signed)
[facility]    CSN: [id number] Arrival date & time: 11/17/22  1145      History   Chief Complaint Chief Complaint  Patient presents with   Finger Injury    HPI Sylvia Fisher is a 57 y.o. female.   Patient presenting today with a crush injury to the right distal pinky finger after shutting it in the door about 30 minutes prior to arrival.  States the area just below the cuticle has been bleeding, wash it in peroxide and has been keeping pressure on it.  Denies decreased range of motion but does have some numb and tingly sensations in the finger.  Does not recall her last tetanus shot was but thinks it was many many years ago, much more than 5 years ago.    Past Medical History:  Diagnosis Date   Anemia    Hypertension     There are no problems to display for this patient.   Past Surgical History:  Procedure Laterality Date   BREAST REDUCTION SURGERY     TUBAL LIGATION      OB History   No obstetric history on file.      Home Medications    Prior to Admission medications   Medication Sig Start Date End Date Taking? Authorizing Provider  chlorhexidine (HIBICLENS) 4 % external liquid Apply topically daily as needed. 11/17/22  Yes Volney American, PA-C  mupirocin ointment (BACTROBAN) 2 % Apply 1 Application topically 2 (two) times daily. 11/17/22  Yes Volney American, PA-C  ibuprofen (ADVIL,MOTRIN) 200 MG tablet Take 200 mg by mouth every 6 (six) hours as needed for mild pain or moderate pain.    [provider]    Family History History reviewed. No pertinent family history.  Social History Social History   Tobacco Use   Smoking status: Never   Smokeless tobacco: Never  Substance Use Topics   Alcohol use: No   Drug use: No     Allergies   Patient has no known allergies.   Review of Systems Review of Systems Per HPI  Physical Exam Triage Vital Signs ED Triage Vitals  Enc Vitals Group     BP 11/17/22 1159  (!) 165/91     Pulse Rate 11/17/22 1159 96     Resp 11/17/22 1159 18     Temp 11/17/22 1159 98.7 F (37.1 C)     Temp Source 11/17/22 1159 Oral     SpO2 11/17/22 1159 98 %     Weight --      Height --      Head Circumference --      Peak Flow --      Pain Score 11/17/22 1204 10     Pain Loc --      Pain Edu? --      Excl. in [place]? --    No data found.  Updated Vital Signs BP (!) 165/91 (BP Location: Left Wrist)   Pulse 96   Temp 98.7 F (37.1 C) (Oral)   Resp 18   LMP 11/19/2015 (Approximate)   SpO2 98%   Visual Acuity Right Eye Distance:   Left Eye Distance:   Bilateral Distance:    Right Eye Near:   Left Eye Near:    Bilateral Near:     Physical Exam Vitals and nursing note reviewed.  Constitutional:      Appearance: Normal appearance. She is not ill-appearing.  HENT:     Head: Atraumatic.  Eyes:  Extraocular Movements: Extraocular movements intact.     Conjunctiva/sclera: Conjunctivae normal.  Cardiovascular:     Rate and Rhythm: Normal rate and regular rhythm.     Heart sounds: Normal heart sounds.  Pulmonary:     Effort: Pulmonary effort is normal.     Breath sounds: Normal breath sounds.  Musculoskeletal:        General: Tenderness and signs of injury present. No swelling or deformity. Normal range of motion.     Cervical back: Normal range of motion and neck supple.     Comments: Range of motion intact but painful of the right little finger.  No bony deformity palpable  Skin:    General: Skin is warm.     Comments: Superficial curved laceration just below the cuticle of the right little finger, minimal active bleeding and fairly well-approximated at rest.  No apparent damage to the nailbed  Neurological:     Mental Status: She is alert and oriented to person, place, and time.     Motor: No weakness.     Gait: Gait normal.     Comments: Right hand neurovascularly intact  Psychiatric:        Mood and Affect: Mood normal.        Thought Content:  Thought content normal.        Judgment: Judgment normal.      UC Treatments / Results  Labs (all labs ordered are listed, but only abnormal results are displayed) Labs Reviewed - No data to display  EKG   Radiology DG Hand Complete Right  Result Date: 11/17/2022 CLINICAL DATA:  Right fifth finger pain after patient shut the car door with the finger in between 30 minutes ago. EXAM: RIGHT HAND - COMPLETE 3+ VIEW COMPARISON:  None Available. FINDINGS: Normal bone mineralization. Neutral ulnar variance. Moderate dorsal thumb interphalangeal joint space narrowing and dorsal peripheral degenerative osteophytosis. Mild-to-moderate second and third DIP joint space narrowing and peripheral osteophytosis. Mild thumb carpometacarpal joint space narrowing and peripheral osteophytosis. No acute fracture or dislocation. IMPRESSION: 1. No acute fracture, with attention to the fifth finger. 2. Moderate thumb interphalangeal osteoarthritis. Electronically Signed   By: Yvonne Kendall M.D.   On: 11/17/2022 12:37    Procedures Procedures (including critical care time)  Medications Ordered in UC Medications  Tdap (BOOSTRIX) injection 0.5 mL (0.5 mLs Intramuscular Given 11/17/22 1255)    Initial Impression / Assessment and Plan / UC Course  I have reviewed the triage vital signs and the nursing notes.  Pertinent labs & imaging results that were available during my care of the patient were reviewed by me and considered in my medical decision making (see chart for details).     X-ray of the right little finger negative for acute bony abnormality, wound was cleaned and dressed today in clinic and Hibiclens, mupirocin sent for home wound care with strict instructions.  Discussed hand elevation, warning signs for infection and return precautions.  Tetanus was also updated today.  Return for worsening symptoms. Final Clinical Impressions(s) / UC Diagnoses   Final diagnoses:  Finger pain, right  Crushing  injury of finger, initial encounter  Laceration of right little finger without foreign body without damage to nail, initial encounter     Discharge Instructions      Clean the area at least 1 time daily with Hibiclens solution and apply mupirocin ointment and a nonstick dressing.  You may use the splint for protection as long as needed.  Elevate the hand  at rest to avoid swelling.  Ibuprofen and Tylenol as needed for pain.  Return for any worsening symptoms.  We have also updated your tetanus shot today.    ED Prescriptions     Medication Sig Dispense Auth. Provider   chlorhexidine (HIBICLENS) 4 % external liquid Apply topically daily as needed. 120 mL Volney American, PA-C   mupirocin ointment (BACTROBAN) 2 % Apply 1 Application topically 2 (two) times daily. 22 g Volney American, [place]      PDMP not reviewed this encounter.   Volney American, [place] 11/17/22 1257

## 2022-11-17 NOTE — ED Triage Notes (Signed)
Pt reports pain in the right pinky finger after she shut the car door with the finger in between 30 min ago.

## 2022-11-17 NOTE — Discharge Instructions (Signed)
Clean the area at least 1 time daily with Hibiclens solution and apply mupirocin ointment and a nonstick dressing.  You may use the splint for protection as long as needed.  Elevate the hand at rest to avoid swelling.  Ibuprofen and Tylenol as needed for pain.  Return for any worsening symptoms.  We have also updated your tetanus shot today.

## 2022-12-09 ENCOUNTER — Ambulatory Visit (INDEPENDENT_AMBULATORY_CARE_PROVIDER_SITE_OTHER): Payer: Medicaid Other | Admitting: Surgery

## 2022-12-09 VITALS — BP 190/117 | HR 84 | Temp 97.9°F | Resp 14 | Ht 65.0 in | Wt 222.0 lb

## 2022-12-09 DIAGNOSIS — K439 Ventral hernia without obstruction or gangrene: Secondary | ICD-10-CM | POA: Diagnosis not present

## 2022-12-09 DIAGNOSIS — I10 Essential (primary) hypertension: Secondary | ICD-10-CM

## 2022-12-10 NOTE — Progress Notes (Signed)
[facility] History and Physical  Reason for Referral: Ventral hernia Referring Physician: Royce Macadamia  Chief Complaint   New Patient (Initial Visit)     Sylvia Fisher is a 57 y.o. female.  HPI: Patient presents for evaluation of her ventral hernia.  It has been present for 20 years and it has been slowly increasing in size.  The hernia has been present since her last pregnancy.  It is usually reducible but has been causing problems with intermittent pains especially when attempting to sleep.  She is tolerating a diet without nausea and vomiting and is moving her bowels, though she does have some baseline constipation.  Her past medical history significant for hypertension.  She has a surgical history significant for tubal ligation.  She denies use of blood thinning medications.  She will occasionally drink alcohol.  She denies use of tobacco products, and illicit drugs.  Past Medical History:  Diagnosis Date   Anemia    Hypertension     Past Surgical History:  Procedure Laterality Date   BREAST REDUCTION SURGERY     TUBAL LIGATION      No family history on file.  Social History   Tobacco Use   Smoking status: Never   Smokeless tobacco: Never  Substance Use Topics   Alcohol use: No   Drug use: No    Medications: I have reviewed the patient's current medications. Allergies as of 12/09/2022   No Known Allergies      Medication List        Accurate as of December 09, 2022 11:59 PM. If you have any questions, ask your nurse or doctor.          STOP taking these medications    chlorhexidine 4 % external liquid Commonly known as: Hibiclens Stopped by: Sylvia Klammer A Lesean Woolverton, DO   mupirocin ointment 2 % Commonly known as: BACTROBAN Stopped by: Sylvia Brier A Ophia Shamoon, DO       TAKE these medications    amLODipine 5 MG tablet Commonly known as: NORVASC Take 5 mg by mouth daily.   ibuprofen 200 MG tablet Commonly known as:  ADVIL Take 200 mg by mouth every 6 (six) hours as needed for mild pain or moderate pain.         ROS:  Constitutional: negative for chills, fatigue, and fevers Eyes: positive for visual disturbance and pain Ears, nose, mouth, throat, and face: negative for ear drainage, sore throat, and sinus problems Respiratory: negative for cough, wheezing, and shortness of breath Cardiovascular: negative for chest pain and palpitations Gastrointestinal: positive for abdominal pain, negative for nausea, reflux symptoms, and vomiting Genitourinary:positive for frequency, negative for dysuria Integument/breast: negative for dryness and rash Hematologic/lymphatic: negative for bleeding and lymphadenopathy Musculoskeletal:positive for back pain Neurological: negative for dizziness and tremors Endocrine: negative for temperature intolerance  Blood pressure (!) 190/117, pulse 84, temperature 97.9 F (36.6 C), temperature source Other (Comment), resp. rate 14, height '5\' 5"'$  (1.651 m), weight 222 lb (100.7 kg), last menstrual period 11/19/2015, SpO2 95 %. Physical Exam Vitals reviewed.  Constitutional:      Appearance: Normal appearance.  HENT:     Head: Normocephalic and atraumatic.  Eyes:     Extraocular Movements: Extraocular movements intact.     Pupils: Pupils are equal, round, and reactive to light.  Cardiovascular:     Rate and Rhythm: Normal rate and regular rhythm.  Pulmonary:     Effort: Pulmonary effort is normal.     Breath sounds:  Normal breath sounds.  Abdominal:     Comments: Abdomen soft, nondistended, no percussion tenderness, nontender to palpation; no rigidity, guarding, or rebound tenderness; supraumbilical midline ventral hernia, reducible, unable to palpate defect but herniated contents are large in size  Musculoskeletal:        General: Normal range of motion.     Cervical back: Normal range of motion.  Skin:    General: Skin is warm and dry.  Neurological:     General:  No focal deficit present.     Mental Status: She is alert and oriented to person, place, and time.  Psychiatric:        Mood and Affect: Mood normal.        Behavior: Behavior normal.     Results: No results found for this or any previous visit (from the past 48 hour(s)).  No results found.   Assessment & Plan:  Sylvia Fisher is a 57 y.o. female who presents for evaluation of ventral hernia.  -I explained the pathophysiology of ventral hernias, and why we recommend surgical repair -CT abdomen and pelvis ordered to fully characterize the hernia, as I am not able to fully palpate the defect -The risk and benefits of robotic assisted laparoscopic ventral hernia repair with mesh were discussed including but not limited to bleeding, infection, injury to surrounding structures, and need for additional procedures.  After careful consideration, Sylvia Fisher has decided to proceed with surgery after obtaining imaging.  -Provided information regarding ventral hernias -Advised her that she needs to present to the emergency department if she begins to have a painful nonreducible hernia, nausea, vomiting, or obstipation -I gave the patient information for local PCPs who are accepting new patients, as she was noted to be very hypertensive during the visit.  She does have amlodipine that she takes when she starts to develop headaches.  I advised that she needs to follow up with a PCP prior to scheduling surgery  All questions were answered to the satisfaction of the patient.  Sylvia Freer, DO [facility] [address] [place], [place] [address] [phone] (office)

## 2022-12-25 ENCOUNTER — Ambulatory Visit (HOSPITAL_BASED_OUTPATIENT_CLINIC_OR_DEPARTMENT_OTHER)
Admission: RE | Admit: 2022-12-25 | Discharge: 2022-12-25 | Disposition: A | Discharge status: Home / Self Care | Payer: Medicaid Other | Source: Ambulatory Visit | Attending: Surgery | Admitting: Surgery

## 2022-12-25 DIAGNOSIS — K439 Ventral hernia without obstruction or gangrene: Secondary | ICD-10-CM | POA: Insufficient documentation

## 2022-12-25 MED ORDER — IOHEXOL 300 MG/ML  SOLN
100.0000 mL | Freq: Once | INTRAMUSCULAR | Status: AC | PRN
  Administered 2022-12-25: 90 mL via INTRAVENOUS

## 2022-12-28 ENCOUNTER — Telehealth (INDEPENDENT_AMBULATORY_CARE_PROVIDER_SITE_OTHER): Payer: Medicaid Other | Admitting: Surgery

## 2022-12-28 DIAGNOSIS — K439 Ventral hernia without obstruction or gangrene: Secondary | ICD-10-CM

## 2022-12-28 NOTE — Telephone Encounter (Signed)
[facility]  I called the patient to update her on the results of her CT scan.  I explained that she has a ventral hernia that contains part of her transverse colon, but there is no evidence of obstruction.  She states that she would like to proceed with scheduling her robotic assisted laparoscopic ventral hernia repair with mesh.  She denies any issues at this time, and has no questions regarding the surgery.  We will get her scheduled for surgery, and my office will call her with the date.  All questions were answered to her expressed satisfaction.  CT abdomen and pelvis (12/25/2022): IMPRESSION: 1. Ventral hernia containing a segment of mid transverse colon. No bowel obstruction. 2. Mild thickened and haziness of a segment of transverse colon proximal to the ventral hernia which may represent a degree of inflammation. 3. Colonic diverticulosis. Normal appendix. 4. Heterogeneity of the endometrium not evaluated on this CT. Further evaluation with pelvic ultrasound recommended.  Graciella Freer, DO [facility] [address] [place], [place] [address] [phone] (office)

## 2023-01-05 ENCOUNTER — Telehealth: Payer: Self-pay | Admitting: *Deleted

## 2023-01-05 NOTE — Telephone Encounter (Signed)
Patient is scheduled to see Dr. Iona Beard for uncontrolled BP on 01/12/2023 @ 10:30 am.   Patient currently scheduled for pre-op appointment for 01/12/2023 @ 10 am and Hernia Repair for 01/17/2023.  Please advise.

## 2023-01-10 NOTE — Telephone Encounter (Signed)
Discussed with Pre-op. Was advised that since her BP is uncontrolled, she will need to be on any meds that may be prescribed for at least 10 before her surgery per anesthesia.

## 2023-01-10 NOTE — Patient Instructions (Addendum)
Sylvia Fisher  [id number]     @PREFPERIOPPHARMACY @   Your procedure is scheduled on  01/17/2023.   Report to [facility] at  0600  A.M.   Call this number if you have problems the morning of surgery:  [phone]  If you experience any cold or flu symptoms such as cough, fever, chills, shortness of breath, etc. between now and your scheduled surgery, please notify us at the above number.   Remember:  Do not eat or drink after midnight.      Take these medicines the morning of surgery with A SIP OF WATER                                          Amlodipine.    Do not wear jewelry, make-up or nail polish.  Do not wear lotions, powders, or perfumes, or deodorant.  Do not shave 48 hours prior to surgery.  Men may shave face and neck.  Do not bring valuables to the hospital.  [facility] is not responsible for any belongings or valuables.  Contacts, dentures or bridgework may not be worn into surgery.  Leave your suitcase in the car.  After surgery it may be brought to your room.  For patients admitted to the hospital, discharge time will be determined by your treatment team.  Patients discharged the day of surgery will not be allowed to drive home and must have someone with them for 24 hours.    Special instructions:   DO NOT smoke tobacco or vape for 24 hours before your procedure.  Please read over the following fact sheets that you were given. Coughing and Deep Breathing, Surgical Site Infection Prevention, Anesthesia Post-op Instructions, and Care and Recovery After Surgery       Laparoscopic Ventral Hernia Repair, Care After The following information offers guidance on how to care for yourself after your procedure. Your health care provider may also give you more specific instructions. If you have problems or questions, contact your health care provider. What can I expect after the procedure? After the procedure, it is common to have pain, discomfort,  or soreness. Follow these instructions at home: Medicines Take over-the-counter and prescription medicines only as told by your health care provider. Ask your health care provider if the medicine prescribed to you: Requires you to avoid driving or using machinery. Can cause constipation. You may need to take these actions to prevent or treat constipation: Drink enough fluid to keep your urine pale yellow. Take over-the-counter or prescription medicines. Eat foods that are high in fiber, such as beans, whole grains, and fresh fruits and vegetables. Limit foods that are high in fat and processed sugars, such as fried or sweet foods. Incision care  Follow instructions from your health care provider about how to take care of your incisions. Make sure you: Wash your hands with soap and water for at least 20 seconds before and after you change your bandage (dressing) or before you touch your abdomen. If soap and water are not available, use hand sanitizer. Change your dressing as told by your health care provider. Leave stitches (sutures), skin glue, or adhesive strips in place. These skin closures may need to stay in place for 2 weeks or longer. If adhesive strip edges start to loosen and curl up, you may trim the loose edges.  Do not remove adhesive strips completely unless your health care provider tells you to do that. Check your incision areas every day for signs of infection. Check for: More redness, swelling, or pain. Fluid or blood. Warmth. Pus or a bad smell. Bathing  Do not take baths, swim, or use a hot tub until your health care provider approves. Ask your health care provider if you may take showers. You may only be allowed to take sponge baths. Keep your dressing dry until your health care provider says it can be removed. Activity  Rest as told by your health care provider. Avoid sitting for a long time without moving. Get up to take short walks every 1-2 hours. This is important  to improve blood flow and breathing. Ask for help if you feel weak or unsteady. Do not lift anything that is heavier than 10 lb (4.5 kg), or the limit that you are told, until your health care provider says that it is safe. If you were given a sedative during the procedure, it can affect you for several hours. Do not drive or operate machinery until your health care provider says that it is safe. Return to your normal activities as told by your health care provider. Ask your health care provider what activities are safe for you. General instructions  Hold a pillow over your abdomen when you cough or sneeze. This helps with pain. Do not use any products that contain nicotine or tobacco. These products include cigarettes, chewing tobacco, and vaping devices, such as e-cigarettes. These can delay healing after surgery. If you need help quitting, ask your health care provider. You may be asked to continue to do deep breathing exercises at home. This will help to prevent a lung infection. Keep all follow-up visits. This is important. Contact a health care provider if: You have any of these signs of infection: More redness, swelling, or pain around an incision. Fluid or blood coming from an incision. Warmth coming from an incision. Pus or a bad smell coming from an incision. A fever or chills. You have pain that gets worse or does not get better with medicine. You have nausea or vomiting. You have a cough. You have shortness of breath. You have not had a bowel movement in 3 days. You are not able to urinate. Get help right away if you have: Severe pain in your abdomen. Persistent nausea and vomiting. Redness, warmth, or pain in your leg. Chest pain. Trouble breathing. These symptoms may represent a serious problem that is an emergency. Do not wait to see if the symptoms will go away. Get medical help right away. Call your local emergency services (911 in the [place] Do not drive yourself to the  hospital. Summary After this procedure, it is common to have pain, discomfort, or soreness. Follow instructions from your health care provider about how to take care of your incision. Check your incision area every day for signs of infection. Report any signs of infection to your health care provider. Keep all follow-up visits. This is important. This information is not intended to replace advice given to you by your health care provider. Make sure you discuss any questions you have with your health care provider. Document Revised: 05/23/2020 Document Reviewed: 05/23/2020 Elsevier Patient Education  [place] Anesthesia, Adult, Care After The following information offers guidance on how to care for yourself after your procedure. Your health care provider may also give you more specific instructions. If you have problems or  questions, contact your health care provider. What can I expect after the procedure? After the procedure, it is common for people to: Have pain or discomfort at the IV site. Have nausea or vomiting. Have a sore throat or hoarseness. Have trouble concentrating. Feel cold or chills. Feel weak, sleepy, or tired (fatigue). Have soreness and body aches. These can affect parts of the body that were not involved in surgery. Follow these instructions at home: For the time period you were told by your health care provider:  Rest. Do not participate in activities where you could fall or become injured. Do not drive or use machinery. Do not drink alcohol. Do not take sleeping pills or medicines that cause drowsiness. Do not make important decisions or sign legal documents. Do not take care of children on your own. General instructions Drink enough fluid to keep your urine pale yellow. If you have sleep apnea, surgery and certain medicines can increase your risk for breathing problems. Follow instructions from your health care provider about wearing your sleep  device: Anytime you are sleeping, including during daytime naps. While taking prescription pain medicines, sleeping medicines, or medicines that make you drowsy. Return to your normal activities as told by your health care provider. Ask your health care provider what activities are safe for you. Take over-the-counter and prescription medicines only as told by your health care provider. Do not use any products that contain nicotine or tobacco. These products include cigarettes, chewing tobacco, and vaping devices, such as e-cigarettes. These can delay incision healing after surgery. If you need help quitting, ask your health care provider. Contact a health care provider if: You have nausea or vomiting that does not get better with medicine. You vomit every time you eat or drink. You have pain that does not get better with medicine. You cannot urinate or have bloody urine. You develop a skin rash. You have a fever. Get help right away if: You have trouble breathing. You have chest pain. You vomit blood. These symptoms may be an emergency. Get help right away. Call 911. Do not wait to see if the symptoms will go away. Do not drive yourself to the hospital. Summary After the procedure, it is common to have a sore throat, hoarseness, nausea, vomiting, or to feel weak, sleepy, or fatigue. For the time period you were told by your health care provider, do not drive or use machinery. Get help right away if you have difficulty breathing, have chest pain, or vomit blood. These symptoms may be an emergency. This information is not intended to replace advice given to you by your health care provider. Make sure you discuss any questions you have with your health care provider. Document Revised: 01/01/2022 Document Reviewed: 01/01/2022 Elsevier Patient Education  [place]. How to Use Chlorhexidine Before Surgery Chlorhexidine gluconate (CHG) is a germ-killing (antiseptic) solution that is  used to clean the skin. It can get rid of the bacteria that normally live on the skin and can keep them away for about 24 hours. To clean your skin with CHG, you may be given: A CHG solution to use in the shower or as part of a sponge bath. A prepackaged cloth that contains CHG. Cleaning your skin with CHG may help lower the risk for infection: While you are staying in the intensive care unit of the hospital. If you have a vascular access, such as a central line, to provide short-term or long-term access to your veins. If you have a  catheter to drain urine from your bladder. If you are on a ventilator. A ventilator is a machine that helps you breathe by moving air in and out of your lungs. After surgery. What are the risks? Risks of using CHG include: A skin reaction. Hearing loss, if CHG gets in your ears and you have a perforated eardrum. Eye injury, if CHG gets in your eyes and is not rinsed out. The CHG product catching fire. Make sure that you avoid smoking and flames after applying CHG to your skin. Do not use CHG: If you have a chlorhexidine allergy or have previously reacted to chlorhexidine. On babies younger than 48 months of age. How to use CHG solution Use CHG only as told by your health care provider, and follow the instructions on the label. Use the full amount of CHG as directed. Usually, this is one bottle. During a shower Follow these steps when using CHG solution during a shower (unless your health care provider gives you different instructions): Start the shower. Use your normal soap and shampoo to wash your face and hair. Turn off the shower or move out of the shower stream. Pour the CHG onto a clean washcloth. Do not use any type of brush or rough-edged sponge. Starting at your neck, lather your body down to your toes. Make sure you follow these instructions: If you will be having surgery, pay special attention to the part of your body where you will be having  surgery. Scrub this area for at least 1 minute. Do not use CHG on your head or face. If the solution gets into your ears or eyes, rinse them well with water. Avoid your genital area. Avoid any areas of skin that have broken skin, cuts, or scrapes. Scrub your back and under your arms. Make sure to wash skin folds. Let the lather sit on your skin for 1-2 minutes or as long as told by your health care provider. Thoroughly rinse your entire body in the shower. Make sure that all body creases and crevices are rinsed well. Dry off with a clean towel. Do not put any substances on your body afterward--such as powder, lotion, or perfume--unless you are told to do so by your health care provider. Only use lotions that are recommended by the manufacturer. Put on clean clothes or pajamas. If it is the night before your surgery, sleep in clean sheets.  During a sponge bath Follow these steps when using CHG solution during a sponge bath (unless your health care provider gives you different instructions): Use your normal soap and shampoo to wash your face and hair. Pour the CHG onto a clean washcloth. Starting at your neck, lather your body down to your toes. Make sure you follow these instructions: If you will be having surgery, pay special attention to the part of your body where you will be having surgery. Scrub this area for at least 1 minute. Do not use CHG on your head or face. If the solution gets into your ears or eyes, rinse them well with water. Avoid your genital area. Avoid any areas of skin that have broken skin, cuts, or scrapes. Scrub your back and under your arms. Make sure to wash skin folds. Let the lather sit on your skin for 1-2 minutes or as long as told by your health care provider. Using a different clean, wet washcloth, thoroughly rinse your entire body. Make sure that all body creases and crevices are rinsed well. Dry off with a clean  towel. Do not put any substances on your body  afterward--such as powder, lotion, or perfume--unless you are told to do so by your health care provider. Only use lotions that are recommended by the manufacturer. Put on clean clothes or pajamas. If it is the night before your surgery, sleep in clean sheets. How to use CHG prepackaged cloths Only use CHG cloths as told by your health care provider, and follow the instructions on the label. Use the CHG cloth on clean, dry skin. Do not use the CHG cloth on your head or face unless your health care provider tells you to. When washing with the CHG cloth: Avoid your genital area. Avoid any areas of skin that have broken skin, cuts, or scrapes. Before surgery Follow these steps when using a CHG cloth to clean before surgery (unless your health care provider gives you different instructions): Using the CHG cloth, vigorously scrub the part of your body where you will be having surgery. Scrub using a back-and-forth motion for 3 minutes. The area on your body should be completely wet with CHG when you are done scrubbing. Do not rinse. Discard the cloth and let the area air-dry. Do not put any substances on the area afterward, such as powder, lotion, or perfume. Put on clean clothes or pajamas. If it is the night before your surgery, sleep in clean sheets.  For general bathing Follow these steps when using CHG cloths for general bathing (unless your health care provider gives you different instructions). Use a separate CHG cloth for each area of your body. Make sure you wash between any folds of skin and between your fingers and toes. Wash your body in the following order, switching to a new cloth after each step: The front of your neck, shoulders, and chest. Both of your arms, under your arms, and your hands. Your stomach and groin area, avoiding the genitals. Your right leg and foot. Your left leg and foot. The back of your neck, your back, and your buttocks. Do not rinse. Discard the cloth and  let the area air-dry. Do not put any substances on your body afterward--such as powder, lotion, or perfume--unless you are told to do so by your health care provider. Only use lotions that are recommended by the manufacturer. Put on clean clothes or pajamas. Contact a health care provider if: Your skin gets irritated after scrubbing. You have questions about using your solution or cloth. You swallow any chlorhexidine. Call your local poison control center (1-661-458-7673 in the [place] Get help right away if: Your eyes itch badly, or they become very red or swollen. Your skin itches badly and is red or swollen. Your hearing changes. You have trouble seeing. You have swelling or tingling in your mouth or throat. You have trouble breathing. These symptoms may represent a serious problem that is an emergency. Do not wait to see if the symptoms will go away. Get medical help right away. Call your local emergency services (911 in the [place] Do not drive yourself to the hospital. Summary Chlorhexidine gluconate (CHG) is a germ-killing (antiseptic) solution that is used to clean the skin. Cleaning your skin with CHG may help to lower your risk for infection. You may be given CHG to use for bathing. It may be in a bottle or in a prepackaged cloth to use on your skin. Carefully follow your health care provider's instructions and the instructions on the product label. Do not use CHG if you have a chlorhexidine allergy. Contact  your health care provider if your skin gets irritated after scrubbing. This information is not intended to replace advice given to you by your health care provider. Make sure you discuss any questions you have with your health care provider. Document Revised: 02/01/2022 Document Reviewed: 12/15/2020 Elsevier Patient Education  [place].

## 2023-01-10 NOTE — Telephone Encounter (Signed)
Call placed to patient and patient made aware.   States that she will follow up after seeing PCP.

## 2023-01-12 ENCOUNTER — Encounter (HOSPITAL_COMMUNITY): Payer: Self-pay

## 2023-01-12 ENCOUNTER — Encounter (HOSPITAL_COMMUNITY)
Admission: RE | Admit: 2023-01-12 | Discharge: 2023-01-12 | Disposition: A | Discharge status: Home / Self Care | Payer: Medicaid Other | Source: Ambulatory Visit | Attending: Surgery | Admitting: Surgery

## 2023-01-12 DIAGNOSIS — I10 Essential (primary) hypertension: Secondary | ICD-10-CM

## 2023-01-13 ENCOUNTER — Encounter: Payer: Self-pay | Admitting: *Deleted

## 2023-01-17 ENCOUNTER — Ambulatory Visit: Admit: 2023-01-17 | Payer: Medicaid Other | Admitting: Surgery

## 2023-01-17 SURGERY — REPAIR, HERNIA, VENTRAL, ROBOT-ASSISTED
Anesthesia: General

## 2023-05-19 ENCOUNTER — Ambulatory Visit: Payer: Medicaid Other | Admitting: Internal Medicine

## 2023-05-19 HISTORY — PX: DENTAL RESTORATION/EXTRACTION WITH X-RAY: SHX5796

## 2023-05-26 ENCOUNTER — Encounter: Payer: Self-pay | Admitting: *Deleted

## 2023-05-27 ENCOUNTER — Encounter: Payer: Self-pay | Admitting: *Deleted

## 2023-05-27 ENCOUNTER — Ambulatory Visit: Payer: Medicaid Other | Attending: Internal Medicine | Admitting: Internal Medicine

## 2023-05-27 ENCOUNTER — Encounter: Payer: Self-pay | Admitting: Internal Medicine

## 2023-05-27 ENCOUNTER — Telehealth: Payer: Self-pay | Admitting: *Deleted

## 2023-05-27 VITALS — BP 164/90 | HR 58 | Ht 65.0 in | Wt 219.4 lb

## 2023-05-27 DIAGNOSIS — R5383 Other fatigue: Secondary | ICD-10-CM

## 2023-05-27 DIAGNOSIS — R0683 Snoring: Secondary | ICD-10-CM

## 2023-05-27 DIAGNOSIS — R079 Chest pain, unspecified: Secondary | ICD-10-CM

## 2023-05-27 DIAGNOSIS — I10 Essential (primary) hypertension: Secondary | ICD-10-CM | POA: Insufficient documentation

## 2023-05-27 DIAGNOSIS — Z01810 Encounter for preprocedural cardiovascular examination: Secondary | ICD-10-CM

## 2023-05-27 DIAGNOSIS — G4733 Obstructive sleep apnea (adult) (pediatric): Secondary | ICD-10-CM

## 2023-05-27 NOTE — Patient Instructions (Addendum)
Medication Instructions:  Your physician has recommended you make the following change in your medication:  Stop propranolol Continue all other medications the same  Labwork: none  Testing/Procedures: Your physician has requested that you have en exercise stress myoview. For further information please visit [email or web address] Please follow instruction sheet, as given. WatchPat Home Sleep Study.-we will contact you when you can proceed with this test  Follow-Up: Your physician recommends that you schedule a follow-up appointment in: 6 months  Any Other Special Instructions Will Be Listed Below (If Applicable).  If you need a refill on your cardiac medications before your next appointment, please call your pharmacy.

## 2023-05-27 NOTE — Telephone Encounter (Signed)
WatchPAT ordered and sent for pre-cert check

## 2023-05-31 DIAGNOSIS — G4733 Obstructive sleep apnea (adult) (pediatric): Secondary | ICD-10-CM | POA: Insufficient documentation

## 2023-05-31 DIAGNOSIS — Z01810 Encounter for preprocedural cardiovascular examination: Secondary | ICD-10-CM | POA: Insufficient documentation

## 2023-05-31 NOTE — Telephone Encounter (Signed)
Patients office note only has additional documentation. Office visit note with signs and symptoms is needed to send to the insurance carrier.

## 2023-05-31 NOTE — Progress Notes (Signed)
Cardiology Office Note  Date: 05/31/2023   ID: Sylvia Fisher, DOB [birth year], MRN [id number]  PCP:  Jeri Lager, FNP  Cardiologist:  Marjo Bicker, MD Electrophysiologist:  None   History of Present Illness: Sylvia Fisher is a 57 y.o. female known to have HTN was referred to cardiology clinic for preop cardiac risk stratification for hernia surgery.  Has some sharp chest pains with both rest and exertion.  Lasting for a few seconds ongoing for quite a while.  Sometimes radiating to the left arm.  Otherwise no orthopnea, PND, leg swelling.  She does snore, feels tired all the time, needs OSA evaluation.    Past Medical History:  Diagnosis Date   Abdominal hernia    Anemia    Headache    Hypertension     Past Surgical History:  Procedure Laterality Date   BREAST REDUCTION SURGERY     DENTAL RESTORATION/EXTRACTION WITH X-RAY  05/2023   TUBAL LIGATION      Current Outpatient Medications  Medication Sig Dispense Refill   ALPRAZolam (XANAX) 0.5 MG tablet Take 0.5 mg by mouth 3 (three) times daily.     amLODipine (NORVASC) 5 MG tablet Take 5 mg by mouth daily.     atorvastatin (LIPITOR) 20 MG tablet Take 20 mg by mouth at bedtime.     carvedilol (COREG) 12.5 MG tablet Take 12.5 mg by mouth 2 (two) times daily.     SUMAtriptan (IMITREX) 25 MG tablet Take 25 mg by mouth every 2 (two) hours as needed.     valsartan-hydrochlorothiazide (DIOVAN-HCT) 80-12.5 MG tablet Take 1 tablet by mouth daily.     Vitamin D, Ergocalciferol, (DRISDOL) 1.25 MG (50000 UNIT) CAPS capsule Take 50,000 Units by mouth once a week.     No current facility-administered medications for this visit.   Allergies:  Patient has no known allergies.   Social History: The patient  reports that she has never smoked. She has never used smokeless tobacco. She reports that she does not drink alcohol and does not use drugs.   Family History: The patient's family history includes Cancer in  her brother; Heart disease in her brother and father.   ROS:  Please see the history of present illness. Otherwise, complete review of systems is positive for none.  All other systems are reviewed and negative.   Physical Exam: VS:  BP (!) 164/90 (BP Location: Left Arm, Cuff Size: Large)   Pulse (!) 58   Ht 5\' 5"  (1.651 m)   Wt 219 lb 6.4 oz (99.5 kg)   LMP 11/19/2015 (Approximate)   SpO2 100%   BMI 36.51 kg/m , BMI Body mass index is 36.51 kg/m.  Wt Readings from Last 3 Encounters:  05/27/23 219 lb 6.4 oz (99.5 kg)  12/09/22 222 lb (100.7 kg)  01/20/16 250 lb (113.4 kg)    General: Patient appears comfortable at rest. HEENT: Conjunctiva and lids normal, oropharynx clear with moist mucosa. Neck: Supple, no elevated JVP or carotid bruits, no thyromegaly. Lungs: Clear to auscultation, nonlabored breathing at rest. Cardiac: Regular rate and rhythm, no S3 or significant systolic murmur, no pericardial rub. Abdomen: Soft, nontender, no hepatomegaly, bowel sounds present, no guarding or rebound. Extremities: No pitting edema, distal pulses 2+. Skin: Warm and dry. Musculoskeletal: No kyphosis. Neuropsychiatric: Alert and oriented x3, affect grossly appropriate.  Recent Labwork: No results found for requested labs within last 365 days.  No results found for: "CHOL", "TRIG", "HDL", "CHOLHDL", "VLDL", "LDLCALC", "LDLDIRECT"  Assessment and Plan:  Preop cardiac risk stratification for hernia surgery Ongoing sharp chest pains lasting for few seconds at both rest and exertion with intermittent radiation to the left arm.  EKG showed NSR, no ischemia.  Will obtain NM stress test for risk stratification.  White coat syndrome with diagnosis of HTN Home BPs range between 120 and 130 mm Hg with a few numbers around 150 mmHg.  Continue amlodipine 5 mg once daily, carvedilol 12.5 mg twice daily, valsartan-HCTZ 80-12.5 mg once daily.  OSA screening Snores, feels tired all the time, will  benefit from OSA screening with a home sleep study.    Medication Adjustments/Labs and Tests Ordered: Current medicines are reviewed at length with the patient today.  Concerns regarding medicines are outlined above.    Disposition:  Follow up 6 months  Signed, Sparsh Callens Verne Spurr, MD, 05/31/2023 5:47 PM    [facility] [address], [place], [place] [address]

## 2023-06-02 ENCOUNTER — Encounter (HOSPITAL_COMMUNITY): Admission: RE | Admit: 2023-06-02 | Payer: Medicaid Other | Source: Ambulatory Visit

## 2023-06-02 ENCOUNTER — Encounter (HOSPITAL_COMMUNITY): Payer: Medicaid Other

## 2023-06-08 ENCOUNTER — Encounter (HOSPITAL_COMMUNITY): Payer: Medicaid Other

## 2023-06-13 ENCOUNTER — Telehealth (HOSPITAL_COMMUNITY): Payer: Self-pay | Admitting: Emergency Medicine

## 2023-06-14 ENCOUNTER — Ambulatory Visit (HOSPITAL_COMMUNITY)
Admission: RE | Admit: 2023-06-14 | Discharge: 2023-06-14 | Disposition: A | Discharge status: Home / Self Care | Payer: Medicaid Other | Source: Ambulatory Visit | Attending: Internal Medicine | Admitting: Internal Medicine

## 2023-06-14 ENCOUNTER — Encounter (HOSPITAL_BASED_OUTPATIENT_CLINIC_OR_DEPARTMENT_OTHER)
Admission: RE | Admit: 2023-06-14 | Discharge: 2023-06-14 | Disposition: A | Discharge status: Home / Self Care | Payer: Medicaid Other | Source: Ambulatory Visit | Attending: Internal Medicine | Admitting: Internal Medicine

## 2023-06-14 DIAGNOSIS — I10 Essential (primary) hypertension: Secondary | ICD-10-CM | POA: Insufficient documentation

## 2023-06-14 DIAGNOSIS — R079 Chest pain, unspecified: Secondary | ICD-10-CM | POA: Insufficient documentation

## 2023-06-14 LAB — NM MYOCAR MULTI W/SPECT W/WALL MOTION / EF
Angina Index: 0
Duke Treadmill Score: 5
Estimated workload: 7
Exercise duration (min): 5 min
Exercise duration (sec): 13 s
LV dias vol: 67 mL (ref 46–106)
LV sys vol: 18 mL
MPHR: 165 {beats}/min
Nuc Stress EF: 73 %
Peak HR: 142 {beats}/min
Percent HR: 86 %
RATE: 0.6
RPE: 13
Rest HR: 65 {beats}/min
Rest Nuclear Isotope Dose: 10.6 mCi
SDS: 0
SRS: 0
SSS: 0
ST Depression (mm): 0 mm
Stress Nuclear Isotope Dose: 30 mCi
TID: 1.18

## 2023-06-14 MED ORDER — SODIUM CHLORIDE FLUSH 0.9 % IV SOLN
INTRAVENOUS | Status: AC
  Filled 2023-06-14: qty 10

## 2023-06-14 MED ORDER — TECHNETIUM TC 99M TETROFOSMIN IV KIT
10.0000 | PACK | Freq: Once | INTRAVENOUS | Status: AC | PRN
  Administered 2023-06-14: 10.6 via INTRAVENOUS

## 2023-06-14 MED ORDER — REGADENOSON 0.4 MG/5ML IV SOLN
INTRAVENOUS | Status: AC
  Filled 2023-06-14: qty 5

## 2023-06-14 MED ORDER — TECHNETIUM TC 99M TETROFOSMIN IV KIT
30.0000 | PACK | Freq: Once | INTRAVENOUS | Status: AC | PRN
  Administered 2023-06-14: 30 via INTRAVENOUS

## 2023-06-15 ENCOUNTER — Telehealth: Payer: Self-pay | Admitting: *Deleted

## 2023-06-15 NOTE — Telephone Encounter (Signed)
Received cardiac clearance.   Appointment scheduled to discuss scheduling surgery.

## 2023-06-21 NOTE — Telephone Encounter (Signed)
Prior Authorization for [facility] sent to [facility] via web portal. Tracking Number . NO AUTH REQ  05/31/23  TO 9/13 24

## 2023-06-22 NOTE — Telephone Encounter (Signed)
Left a message for patient to call office back regarding Sylvia Fisher.  

## 2023-06-23 NOTE — Telephone Encounter (Signed)
Left a message for patient to call office back regarding Sylvia Fisher.  

## 2023-06-24 NOTE — Telephone Encounter (Signed)
Left a message for patient to call office regarding [place].   Message sent to pt via MyChart.

## 2023-06-28 ENCOUNTER — Ambulatory Visit (INDEPENDENT_AMBULATORY_CARE_PROVIDER_SITE_OTHER): Payer: Medicaid Other | Admitting: Surgery

## 2023-06-28 ENCOUNTER — Encounter: Payer: Self-pay | Admitting: Surgery

## 2023-06-28 VITALS — BP 137/82 | HR 74 | Temp 98.2°F | Resp 14 | Ht 65.0 in | Wt 210.0 lb

## 2023-06-28 DIAGNOSIS — I10 Essential (primary) hypertension: Secondary | ICD-10-CM | POA: Diagnosis not present

## 2023-06-28 DIAGNOSIS — K439 Ventral hernia without obstruction or gangrene: Secondary | ICD-10-CM

## 2023-06-28 NOTE — Progress Notes (Signed)
[facility] History and Physical  Reason for Referral: Ventral hernia Referring Physician: Derenda Mis, FNP  Chief Complaint   Follow-up     Sylvia Fisher is a 57 y.o. female.  HPI: Patient is presenting to the office to discuss rescheduling her hernia surgery.  She was initially scheduled for surgery in April of this year, but was noted to have uncontrolled hypertension and needed to see a primary care doctor about beginning new blood pressure medications.  Her primary care doctor also wanted her to follow-up with cardiology.  She has since followed up with cardiology and undergone a stress test which was normal.  She has been doing well since her last visit with me.  The hernia is still present and intermittently will cause her pain, but she is normally able to reduce it without issue.  Her past medical history is significant for hypertension.  Her surgical history is significant for tubal ligation.  She denies use of blood thinning medications.  She occasionally drinks alcohol but denies use of tobacco products and illicit drugs.  Past Medical History:  Diagnosis Date   Abdominal hernia    Anemia    Headache    Hypertension     Past Surgical History:  Procedure Laterality Date   BREAST REDUCTION SURGERY     DENTAL RESTORATION/EXTRACTION WITH X-RAY  05/2023   TUBAL LIGATION      Family History  Problem Relation Age of Onset   Heart disease Father    Cancer Brother    Heart disease Brother     Social History   Tobacco Use   Smoking status: Never   Smokeless tobacco: Never  Substance Use Topics   Alcohol use: No   Drug use: No    Medications: I have reviewed the patient's current medications. Allergies as of 06/28/2023   No Known Allergies      Medication List        Accurate as of June 28, 2023 11:46 AM. If you have any questions, ask your nurse or doctor.          ALPRAZolam 0.5 MG tablet Commonly known as:  XANAX Take 0.5 mg by mouth 3 (three) times daily.   amLODipine 5 MG tablet Commonly known as: NORVASC Take 5 mg by mouth daily.   atorvastatin 20 MG tablet Commonly known as: LIPITOR Take 20 mg by mouth at bedtime.   carvedilol 12.5 MG tablet Commonly known as: COREG Take 12.5 mg by mouth 2 (two) times daily.   SUMAtriptan 25 MG tablet Commonly known as: IMITREX Take 25 mg by mouth every 2 (two) hours as needed.   valsartan-hydrochlorothiazide 80-12.5 MG tablet Commonly known as: DIOVAN-HCT Take 1 tablet by mouth daily.   Vitamin D (Ergocalciferol) 1.25 MG (50000 UNIT) Caps capsule Commonly known as: DRISDOL Take 50,000 Units by mouth once a week.         ROS:  Constitutional: negative for chills, fatigue, and fevers Eyes: negative for visual disturbance and pain Ears, nose, mouth, throat, and face: negative for ear drainage, sore throat, and sinus problems Respiratory: negative for cough, wheezing, and shortness of breath Cardiovascular: negative for chest pain and palpitations Gastrointestinal: positive for abdominal pain, negative for nausea, reflux symptoms, and vomiting Genitourinary:negative for dysuria and frequency Integument/breast: negative for dryness and rash Hematologic/lymphatic: negative for bleeding and lymphadenopathy Musculoskeletal:negative for back pain and neck pain Neurological: negative for dizziness and tremors Endocrine: negative for temperature intolerance  Blood pressure 137/82, pulse 74, temperature  98.2 F (36.8 C), temperature source Oral, resp. rate 14, height 5\' 5"  (1.651 m), weight 210 lb (95.3 kg), last menstrual period 11/19/2015, SpO2 96%. Physical Exam Vitals reviewed.  Constitutional:      Appearance: Normal appearance.  HENT:     Head: Normocephalic and atraumatic.  Eyes:     Extraocular Movements: Extraocular movements intact.     Pupils: Pupils are equal, round, and reactive to light.  Cardiovascular:     Rate and  Rhythm: Normal rate and regular rhythm.  Pulmonary:     Effort: Pulmonary effort is normal.     Breath sounds: Normal breath sounds.  Abdominal:     Comments: Abdomen soft, nondistended, no percussion tenderness, nontender to palpation; no rigidity, guarding, rebound tenderness; soft and reducible supraumbilical ventral hernia, nontender to palpation  Musculoskeletal:        General: Normal range of motion.     Cervical back: Normal range of motion.  Skin:    General: Skin is warm and dry.  Neurological:     General: No focal deficit present.     Mental Status: She is alert and oriented to person, place, and time.  Psychiatric:        Mood and Affect: Mood normal.        Behavior: Behavior normal.     Results: CT of the abdomen and pelvis (12/25/2022): IMPRESSION: 1. Ventral hernia containing a segment of mid transverse colon. No bowel obstruction. 2. Mild thickened and haziness of a segment of transverse colon proximal to the ventral hernia which may represent a degree of inflammation. 3. Colonic diverticulosis. Normal appendix. 4. Heterogeneity of the endometrium not evaluated on this CT. Further evaluation with pelvic ultrasound recommended.   Assessment & Plan:  Sylvia Fisher is a 57 y.o. female who presents to schedule surgery for ventral hernia repair.  -We again discussed the pathophysiology of hernias and the need for surgical repair -The risk and benefits of robotic assisted laparoscopic ventral hernia repair with mesh were discussed including but not limited to bleeding, infection, injury to surrounding structures, need for additional procedures, and hernia recurrence.  After careful consideration, Dynasia H Plagens has decided to proceed with surgery.  -Patient tentatively scheduled for surgery on 9/26 -Information provided to the patient regarding ventral hernias -Advised if she begins to have very tender and nonreducible hernia bulge, nausea, vomiting, and  obstipation, she needs to present to the emergency department for evaluation  All questions were answered to the satisfaction of the patient.   Theophilus Kinds, DO [facility] [address] [place], [place] [address] [phone] (office)

## 2023-07-01 NOTE — H&P (Signed)
[facility] History and Physical  Reason for Referral: Ventral hernia Referring Physician: Derenda Mis, FNP  Chief Complaint   Follow-up     Sylvia Fisher is a 57 y.o. female.  HPI: Patient is presenting to the office to discuss rescheduling her hernia surgery.  She was initially scheduled for surgery in April of this year, but was noted to have uncontrolled hypertension and needed to see a primary care doctor about beginning new blood pressure medications.  Her primary care doctor also wanted her to follow-up with cardiology.  She has since followed up with cardiology and undergone a stress test which was normal.  She has been doing well since her last visit with me.  The hernia is still present and intermittently will cause her pain, but she is normally able to reduce it without issue.  Her past medical history is significant for hypertension.  Her surgical history is significant for tubal ligation.  She denies use of blood thinning medications.  She occasionally drinks alcohol but denies use of tobacco products and illicit drugs.  Past Medical History:  Diagnosis Date   Abdominal hernia    Anemia    Headache    Hypertension     Past Surgical History:  Procedure Laterality Date   BREAST REDUCTION SURGERY     DENTAL RESTORATION/EXTRACTION WITH X-RAY  05/2023   TUBAL LIGATION      Family History  Problem Relation Age of Onset   Heart disease Father    Cancer Brother    Heart disease Brother     Social History   Tobacco Use   Smoking status: Never   Smokeless tobacco: Never  Substance Use Topics   Alcohol use: No   Drug use: No    Medications: I have reviewed the patient's current medications. Allergies as of 06/28/2023   No Known Allergies      Medication List        Accurate as of June 28, 2023 11:46 AM. If you have any questions, ask your nurse or doctor.          ALPRAZolam 0.5 MG tablet Commonly known as:  XANAX Take 0.5 mg by mouth 3 (three) times daily.   amLODipine 5 MG tablet Commonly known as: NORVASC Take 5 mg by mouth daily.   atorvastatin 20 MG tablet Commonly known as: LIPITOR Take 20 mg by mouth at bedtime.   carvedilol 12.5 MG tablet Commonly known as: COREG Take 12.5 mg by mouth 2 (two) times daily.   SUMAtriptan 25 MG tablet Commonly known as: IMITREX Take 25 mg by mouth every 2 (two) hours as needed.   valsartan-hydrochlorothiazide 80-12.5 MG tablet Commonly known as: DIOVAN-HCT Take 1 tablet by mouth daily.   Vitamin D (Ergocalciferol) 1.25 MG (50000 UNIT) Caps capsule Commonly known as: DRISDOL Take 50,000 Units by mouth once a week.         ROS:  Constitutional: negative for chills, fatigue, and fevers Eyes: negative for visual disturbance and pain Ears, nose, mouth, throat, and face: negative for ear drainage, sore throat, and sinus problems Respiratory: negative for cough, wheezing, and shortness of breath Cardiovascular: negative for chest pain and palpitations Gastrointestinal: positive for abdominal pain, negative for nausea, reflux symptoms, and vomiting Genitourinary:negative for dysuria and frequency Integument/breast: negative for dryness and rash Hematologic/lymphatic: negative for bleeding and lymphadenopathy Musculoskeletal:negative for back pain and neck pain Neurological: negative for dizziness and tremors Endocrine: negative for temperature intolerance  Blood pressure 137/82, pulse 74, temperature  98.2 F (36.8 C), temperature source Oral, resp. rate 14, height 5\' 5"  (1.651 m), weight 210 lb (95.3 kg), last menstrual period 11/19/2015, SpO2 96%. Physical Exam Vitals reviewed.  Constitutional:      Appearance: Normal appearance.  HENT:     Head: Normocephalic and atraumatic.  Eyes:     Extraocular Movements: Extraocular movements intact.     Pupils: Pupils are equal, round, and reactive to light.  Cardiovascular:     Rate and  Rhythm: Normal rate and regular rhythm.  Pulmonary:     Effort: Pulmonary effort is normal.     Breath sounds: Normal breath sounds.  Abdominal:     Comments: Abdomen soft, nondistended, no percussion tenderness, nontender to palpation; no rigidity, guarding, rebound tenderness; soft and reducible supraumbilical ventral hernia, nontender to palpation  Musculoskeletal:        General: Normal range of motion.     Cervical back: Normal range of motion.  Skin:    General: Skin is warm and dry.  Neurological:     General: No focal deficit present.     Mental Status: She is alert and oriented to person, place, and time.  Psychiatric:        Mood and Affect: Mood normal.        Behavior: Behavior normal.     Results: CT of the abdomen and pelvis (12/25/2022): IMPRESSION: 1. Ventral hernia containing a segment of mid transverse colon. No bowel obstruction. 2. Mild thickened and haziness of a segment of transverse colon proximal to the ventral hernia which may represent a degree of inflammation. 3. Colonic diverticulosis. Normal appendix. 4. Heterogeneity of the endometrium not evaluated on this CT. Further evaluation with pelvic ultrasound recommended.   Assessment & Plan:  Sylvia Fisher is a 57 y.o. female who presents to schedule surgery for ventral hernia repair.  -We again discussed the pathophysiology of hernias and the need for surgical repair -The risk and benefits of robotic assisted laparoscopic ventral hernia repair with mesh were discussed including but not limited to bleeding, infection, injury to surrounding structures, need for additional procedures, and hernia recurrence.  After careful consideration, Sylvia Fisher has decided to proceed with surgery.  -Patient tentatively scheduled for surgery on 9/26 -Information provided to the patient regarding ventral hernias -Advised if she begins to have very tender and nonreducible hernia bulge, nausea, vomiting, and  obstipation, she needs to present to the emergency department for evaluation  All questions were answered to the satisfaction of the patient.   Theophilus Kinds, DO [facility] [address] [place], [place] [address] [phone] (office)

## 2023-07-08 NOTE — Patient Instructions (Addendum)
Sylvia Fisher  07/08/2023     @PREFPERIOPPHARMACY @   Your procedure is scheduled on  07/14/2023.   Report to [facility] at  0600  A.M.   Call this number if you have problems the morning of surgery:  [phone]  If you experience any cold or flu symptoms such as cough, fever, chills, shortness of breath, etc. between now and your scheduled surgery, please notify us at the above number.   Remember:  Do not eat or drink after midnight.      Take these medicines the morning of surgery with A SIP OF WATER                     zofran (if needed), imitrex (if needed).     Do not wear jewelry, make-up or nail polish, including gel polish,  artificial nails, or any other type of covering on natural nails (fingers and  toes).  Do not wear lotions, powders, or perfumes, or deodorant.  Do not shave 48 hours prior to surgery.  Men may shave face and neck.  Do not bring valuables to the hospital.  [facility] is not responsible for any belongings or valuables.  Contacts, dentures or bridgework may not be worn into surgery.  Leave your suitcase in the car.  After surgery it may be brought to your room.  For patients admitted to the hospital, discharge time will be determined by your treatment team.  Patients discharged the day of surgery will not be allowed to drive home and must have someone with them for 24 hours.    Special instructions:   DO NOT smoke tobacco or vape for 24 hours before your procedure.  Please read over the following fact sheets that you were given. Coughing and Deep Breathing, Surgical Site Infection Prevention, Anesthesia Post-op Instructions, and Care and Recovery After Surgery         Laparoscopic Ventral Hernia Repair, Care After The following information offers guidance on how to care for yourself after your procedure. Your health care provider may also give you more specific instructions. If you have problems or questions, contact your  health care provider. What can I expect after the procedure? After the procedure, it is common to have pain, discomfort, or soreness. Follow these instructions at home: Medicines Take over-the-counter and prescription medicines only as told by your health care provider. Ask your health care provider if the medicine prescribed to you: Requires you to avoid driving or using machinery. Can cause constipation. You may need to take these actions to prevent or treat constipation: Drink enough fluid to keep your urine pale yellow. Take over-the-counter or prescription medicines. Eat foods that are high in fiber, such as beans, whole grains, and fresh fruits and vegetables. Limit foods that are high in fat and processed sugars, such as fried or sweet foods. Incision care  Follow instructions from your health care provider about how to take care of your incisions. Make sure you: Wash your hands with soap and water for at least 20 seconds before and after you change your bandage (dressing) or before you touch your abdomen. If soap and water are not available, use hand sanitizer. Change your dressing as told by your health care provider. Leave stitches (sutures), skin glue, or adhesive strips in place. These skin closures may need to stay in place for 2 weeks or longer. If adhesive strip edges start to loosen and curl up, you may trim  the loose edges. Do not remove adhesive strips completely unless your health care provider tells you to do that. Check your incision areas every day for signs of infection. Check for: More redness, swelling, or pain. Fluid or blood. Warmth. Pus or a bad smell. Bathing  Do not take baths, swim, or use a hot tub until your health care provider approves. Ask your health care provider if you may take showers. You may only be allowed to take sponge baths. Keep your dressing dry until your health care provider says it can be removed. Activity  Rest as told by your health  care provider. Avoid sitting for a long time without moving. Get up to take short walks every 1-2 hours. This is important to improve blood flow and breathing. Ask for help if you feel weak or unsteady. Do not lift anything that is heavier than 10 lb (4.5 kg), or the limit that you are told, until your health care provider says that it is safe. If you were given a sedative during the procedure, it can affect you for several hours. Do not drive or operate machinery until your health care provider says that it is safe. Return to your normal activities as told by your health care provider. Ask your health care provider what activities are safe for you. General instructions  Hold a pillow over your abdomen when you cough or sneeze. This helps with pain. Do not use any products that contain nicotine or tobacco. These products include cigarettes, chewing tobacco, and vaping devices, such as e-cigarettes. These can delay healing after surgery. If you need help quitting, ask your health care provider. You may be asked to continue to do deep breathing exercises at home. This will help to prevent a lung infection. Keep all follow-up visits. This is important. Contact a health care provider if: You have any of these signs of infection: More redness, swelling, or pain around an incision. Fluid or blood coming from an incision. Warmth coming from an incision. Pus or a bad smell coming from an incision. A fever or chills. You have pain that gets worse or does not get better with medicine. You have nausea or vomiting. You have a cough. You have shortness of breath. You have not had a bowel movement in 3 days. You are not able to urinate. Get help right away if you have: Severe pain in your abdomen. Persistent nausea and vomiting. Redness, warmth, or pain in your leg. Chest pain. Trouble breathing. These symptoms may represent a serious problem that is an emergency. Do not wait to see if the symptoms  will go away. Get medical help right away. Call your local emergency services (911 in the [place] Do not drive yourself to the hospital. Summary After this procedure, it is common to have pain, discomfort, or soreness. Follow instructions from your health care provider about how to take care of your incision. Check your incision area every day for signs of infection. Report any signs of infection to your health care provider. Keep all follow-up visits. This is important. This information is not intended to replace advice given to you by your health care provider. Make sure you discuss any questions you have with your health care provider. Document Revised: 05/23/2020 Document Reviewed: 05/23/2020 Elsevier Patient Education  2024 [employer]. General Anesthesia, Adult, Care After The following information offers guidance on how to care for yourself after your procedure. Your health care provider may also give you more specific instructions. If you  have problems or questions, contact your health care provider. What can I expect after the procedure? After the procedure, it is common for people to: Have pain or discomfort at the IV site. Have nausea or vomiting. Have a sore throat or hoarseness. Have trouble concentrating. Feel cold or chills. Feel weak, sleepy, or tired (fatigue). Have soreness and body aches. These can affect parts of the body that were not involved in surgery. Follow these instructions at home: For the time period you were told by your health care provider:  Rest. Do not participate in activities where you could fall or become injured. Do not drive or use machinery. Do not drink alcohol. Do not take sleeping pills or medicines that cause drowsiness. Do not make important decisions or sign legal documents. Do not take care of children on your own. General instructions Drink enough fluid to keep your urine pale yellow. If you have sleep apnea, surgery and certain  medicines can increase your risk for breathing problems. Follow instructions from your health care provider about wearing your sleep device: Anytime you are sleeping, including during daytime naps. While taking prescription pain medicines, sleeping medicines, or medicines that make you drowsy. Return to your normal activities as told by your health care provider. Ask your health care provider what activities are safe for you. Take over-the-counter and prescription medicines only as told by your health care provider. Do not use any products that contain nicotine or tobacco. These products include cigarettes, chewing tobacco, and vaping devices, such as e-cigarettes. These can delay incision healing after surgery. If you need help quitting, ask your health care provider. Contact a health care provider if: You have nausea or vomiting that does not get better with medicine. You vomit every time you eat or drink. You have pain that does not get better with medicine. You cannot urinate or have bloody urine. You develop a skin rash. You have a fever. Get help right away if: You have trouble breathing. You have chest pain. You vomit blood. These symptoms may be an emergency. Get help right away. Call 911. Do not wait to see if the symptoms will go away. Do not drive yourself to the hospital. Summary After the procedure, it is common to have a sore throat, hoarseness, nausea, vomiting, or to feel weak, sleepy, or fatigue. For the time period you were told by your health care provider, do not drive or use machinery. Get help right away if you have difficulty breathing, have chest pain, or vomit blood. These symptoms may be an emergency. This information is not intended to replace advice given to you by your health care provider. Make sure you discuss any questions you have with your health care provider. Document Revised: 01/01/2022 Document Reviewed: 01/01/2022 Elsevier Patient Education  2024  Elsevier Inc. How to Use Chlorhexidine Before Surgery Chlorhexidine gluconate (CHG) is a germ-killing (antiseptic) solution that is used to clean the skin. It can get rid of the bacteria that normally live on the skin and can keep them away for about 24 hours. To clean your skin with CHG, you may be given: A CHG solution to use in the shower or as part of a sponge bath. A prepackaged cloth that contains CHG. Cleaning your skin with CHG may help lower the risk for infection: While you are staying in the intensive care unit of the hospital. If you have a vascular access, such as a central line, to provide short-term or long-term access to your veins. If  you have a catheter to drain urine from your bladder. If you are on a ventilator. A ventilator is a machine that helps you breathe by moving air in and out of your lungs. After surgery. What are the risks? Risks of using CHG include: A skin reaction. Hearing loss, if CHG gets in your ears and you have a perforated eardrum. Eye injury, if CHG gets in your eyes and is not rinsed out. The CHG product catching fire. Make sure that you avoid smoking and flames after applying CHG to your skin. Do not use CHG: If you have a chlorhexidine allergy or have previously reacted to chlorhexidine. On babies younger than 32 months of age. How to use CHG solution Use CHG only as told by your health care provider, and follow the instructions on the label. Use the full amount of CHG as directed. Usually, this is one bottle. During a shower Follow these steps when using CHG solution during a shower (unless your health care provider gives you different instructions): Start the shower. Use your normal soap and shampoo to wash your face and hair. Turn off the shower or move out of the shower stream. Pour the CHG onto a clean washcloth. Do not use any type of brush or rough-edged sponge. Starting at your neck, lather your body down to your toes. Make sure you  follow these instructions: If you will be having surgery, pay special attention to the part of your body where you will be having surgery. Scrub this area for at least 1 minute. Do not use CHG on your head or face. If the solution gets into your ears or eyes, rinse them well with water. Avoid your genital area. Avoid any areas of skin that have broken skin, cuts, or scrapes. Scrub your back and under your arms. Make sure to wash skin folds. Let the lather sit on your skin for 1-2 minutes or as long as told by your health care provider. Thoroughly rinse your entire body in the shower. Make sure that all body creases and crevices are rinsed well. Dry off with a clean towel. Do not put any substances on your body afterward--such as powder, lotion, or perfume--unless you are told to do so by your health care provider. Only use lotions that are recommended by the manufacturer. Put on clean clothes or pajamas. If it is the night before your surgery, sleep in clean sheets.  During a sponge bath Follow these steps when using CHG solution during a sponge bath (unless your health care provider gives you different instructions): Use your normal soap and shampoo to wash your face and hair. Pour the CHG onto a clean washcloth. Starting at your neck, lather your body down to your toes. Make sure you follow these instructions: If you will be having surgery, pay special attention to the part of your body where you will be having surgery. Scrub this area for at least 1 minute. Do not use CHG on your head or face. If the solution gets into your ears or eyes, rinse them well with water. Avoid your genital area. Avoid any areas of skin that have broken skin, cuts, or scrapes. Scrub your back and under your arms. Make sure to wash skin folds. Let the lather sit on your skin for 1-2 minutes or as long as told by your health care provider. Using a different clean, wet washcloth, thoroughly rinse your entire body.  Make sure that all body creases and crevices are rinsed well. Dry  off with a clean towel. Do not put any substances on your body afterward--such as powder, lotion, or perfume--unless you are told to do so by your health care provider. Only use lotions that are recommended by the manufacturer. Put on clean clothes or pajamas. If it is the night before your surgery, sleep in clean sheets. How to use CHG prepackaged cloths Only use CHG cloths as told by your health care provider, and follow the instructions on the label. Use the CHG cloth on clean, dry skin. Do not use the CHG cloth on your head or face unless your health care provider tells you to. When washing with the CHG cloth: Avoid your genital area. Avoid any areas of skin that have broken skin, cuts, or scrapes. Before surgery Follow these steps when using a CHG cloth to clean before surgery (unless your health care provider gives you different instructions): Using the CHG cloth, vigorously scrub the part of your body where you will be having surgery. Scrub using a back-and-forth motion for 3 minutes. The area on your body should be completely wet with CHG when you are done scrubbing. Do not rinse. Discard the cloth and let the area air-dry. Do not put any substances on the area afterward, such as powder, lotion, or perfume. Put on clean clothes or pajamas. If it is the night before your surgery, sleep in clean sheets.  For general bathing Follow these steps when using CHG cloths for general bathing (unless your health care provider gives you different instructions). Use a separate CHG cloth for each area of your body. Make sure you wash between any folds of skin and between your fingers and toes. Wash your body in the following order, switching to a new cloth after each step: The front of your neck, shoulders, and chest. Both of your arms, under your arms, and your hands. Your stomach and groin area, avoiding the genitals. Your right  leg and foot. Your left leg and foot. The back of your neck, your back, and your buttocks. Do not rinse. Discard the cloth and let the area air-dry. Do not put any substances on your body afterward--such as powder, lotion, or perfume--unless you are told to do so by your health care provider. Only use lotions that are recommended by the manufacturer. Put on clean clothes or pajamas. Contact a health care provider if: Your skin gets irritated after scrubbing. You have questions about using your solution or cloth. You swallow any chlorhexidine. Call your local poison control center [phone] in the [place] Get help right away if: Your eyes itch badly, or they become very red or swollen. Your skin itches badly and is red or swollen. Your hearing changes. You have trouble seeing. You have swelling or tingling in your mouth or throat. You have trouble breathing. These symptoms may represent a serious problem that is an emergency. Do not wait to see if the symptoms will go away. Get medical help right away. Call your local emergency services (911 in the [place] Do not drive yourself to the hospital. Summary Chlorhexidine gluconate (CHG) is a germ-killing (antiseptic) solution that is used to clean the skin. Cleaning your skin with CHG may help to lower your risk for infection. You may be given CHG to use for bathing. It may be in a bottle or in a prepackaged cloth to use on your skin. Carefully follow your health care provider's instructions and the instructions on the product label. Do not use CHG if you have a  chlorhexidine allergy. Contact your health care provider if your skin gets irritated after scrubbing. This information is not intended to replace advice given to you by your health care provider. Make sure you discuss any questions you have with your health care provider. Document Revised: 02/01/2022 Document Reviewed: 12/15/2020 Elsevier Patient Education  2023 [employer].

## 2023-07-12 ENCOUNTER — Encounter (HOSPITAL_COMMUNITY)
Admission: RE | Admit: 2023-07-12 | Discharge: 2023-07-12 | Disposition: A | Discharge status: Home / Self Care | Payer: Medicaid Other | Source: Ambulatory Visit | Attending: Surgery | Admitting: Surgery

## 2023-07-12 ENCOUNTER — Encounter (HOSPITAL_COMMUNITY): Payer: Self-pay

## 2023-07-12 VITALS — BP 137/82 | HR 74 | Temp 98.2°F | Resp 18 | Ht 65.0 in | Wt 210.0 lb

## 2023-07-12 DIAGNOSIS — D649 Anemia, unspecified: Secondary | ICD-10-CM | POA: Diagnosis not present

## 2023-07-12 DIAGNOSIS — Z01818 Encounter for other preprocedural examination: Secondary | ICD-10-CM | POA: Diagnosis present

## 2023-07-12 DIAGNOSIS — Z79899 Other long term (current) drug therapy: Secondary | ICD-10-CM | POA: Insufficient documentation

## 2023-07-12 DIAGNOSIS — Z01812 Encounter for preprocedural laboratory examination: Secondary | ICD-10-CM | POA: Insufficient documentation

## 2023-07-12 LAB — BASIC METABOLIC PANEL
Anion gap: 9 (ref 5–15)
BUN: 12 mg/dL (ref 6–20)
CO2: 25 mmol/L (ref 22–32)
Calcium: 8.4 mg/dL — ABNORMAL LOW (ref 8.9–10.3)
Chloride: 105 mmol/L (ref 98–111)
Creatinine, Ser: 1.08 mg/dL — ABNORMAL HIGH (ref 0.44–1.00)
GFR, Estimated: >60 mL/min (ref >60)
Glucose, Bld: 76 mg/dL (ref 70–99)
Potassium: 3.5 mmol/L (ref 3.5–5.1)
Sodium: 139 mmol/L (ref 135–145)

## 2023-07-12 LAB — CBC WITH DIFFERENTIAL/PLATELET
Abs Immature Granulocytes: 0.01 10*3/uL (ref 0.00–0.07)
Basophils Absolute: 0 10*3/uL (ref 0.0–0.1)
Basophils Relative: 1 %
Eosinophils Absolute: 0.1 10*3/uL (ref 0.0–0.5)
Eosinophils Relative: 3 %
HCT: 42.9 % (ref 36.0–46.0)
Hemoglobin: 14.3 g/dL (ref 12.0–15.0)
Immature Granulocytes: 0 %
Lymphocytes Relative: 49 %
Lymphs Abs: 2.3 10*3/uL (ref 0.7–4.0)
MCH: 32.6 pg (ref 26.0–34.0)
MCHC: 33.3 g/dL (ref 30.0–36.0)
MCV: 97.7 fL (ref 80.0–100.0)
Monocytes Absolute: 0.4 10*3/uL (ref 0.1–1.0)
Monocytes Relative: 9 %
Neutro Abs: 1.8 10*3/uL (ref 1.7–7.7)
Neutrophils Relative %: 38 %
Platelets: 232 10*3/uL (ref 150–400)
RBC: 4.39 MIL/uL (ref 3.87–5.11)
RDW: 14 % (ref 11.5–15.5)
WBC: 4.7 10*3/uL (ref 4.0–10.5)
nRBC: 0 % (ref 0.0–0.2)

## 2023-07-14 ENCOUNTER — Ambulatory Visit (HOSPITAL_COMMUNITY): Payer: Medicaid Other | Admitting: Anesthesiology

## 2023-07-14 ENCOUNTER — Encounter (HOSPITAL_COMMUNITY): Payer: Self-pay | Admitting: Surgery

## 2023-07-14 ENCOUNTER — Ambulatory Visit (HOSPITAL_BASED_OUTPATIENT_CLINIC_OR_DEPARTMENT_OTHER): Payer: Medicaid Other | Admitting: Anesthesiology

## 2023-07-14 ENCOUNTER — Encounter (HOSPITAL_COMMUNITY)
Admission: RE | Disposition: A | Discharge status: Home / Self Care | Payer: Self-pay | Source: Home / Self Care | Attending: Surgery

## 2023-07-14 ENCOUNTER — Ambulatory Visit (HOSPITAL_COMMUNITY)
Admission: RE | Admit: 2023-07-14 | Discharge: 2023-07-14 | Disposition: A | Discharge status: Home / Self Care | Payer: Medicaid Other | Attending: Surgery | Admitting: Surgery

## 2023-07-14 DIAGNOSIS — I10 Essential (primary) hypertension: Secondary | ICD-10-CM | POA: Insufficient documentation

## 2023-07-14 DIAGNOSIS — Z9851 Tubal ligation status: Secondary | ICD-10-CM | POA: Insufficient documentation

## 2023-07-14 DIAGNOSIS — K439 Ventral hernia without obstruction or gangrene: Secondary | ICD-10-CM | POA: Insufficient documentation

## 2023-07-14 DIAGNOSIS — K660 Peritoneal adhesions (postprocedural) (postinfection): Secondary | ICD-10-CM | POA: Diagnosis not present

## 2023-07-14 DIAGNOSIS — K436 Other and unspecified ventral hernia with obstruction, without gangrene: Secondary | ICD-10-CM | POA: Diagnosis not present

## 2023-07-14 DIAGNOSIS — K5730 Diverticulosis of large intestine without perforation or abscess without bleeding: Secondary | ICD-10-CM | POA: Diagnosis not present

## 2023-07-14 HISTORY — PX: XI ROBOTIC ASSISTED VENTRAL HERNIA: SHX6789

## 2023-07-14 SURGERY — REPAIR, HERNIA, VENTRAL, ROBOT-ASSISTED
Anesthesia: General | Site: Abdomen

## 2023-07-14 MED ORDER — BUPIVACAINE HCL (PF) 0.5 % IJ SOLN
INTRAMUSCULAR | Status: DC | PRN
  Administered 2023-07-14: 30 mL

## 2023-07-14 MED ORDER — DOCUSATE SODIUM 100 MG PO CAPS: 100.0000 mg | ORAL_CAPSULE | Freq: Two times a day (BID) | ORAL | 2 refills | Status: AC

## 2023-07-14 MED ORDER — CEFAZOLIN SODIUM-DEXTROSE 2-4 GM/100ML-% IV SOLN
2.0000 g | INTRAVENOUS | Status: AC
  Administered 2023-07-14: 2 g via INTRAVENOUS
  Filled 2023-07-14: qty 100

## 2023-07-14 MED ORDER — FENTANYL CITRATE (PF) 250 MCG/5ML IJ SOLN
INTRAMUSCULAR | Status: AC
  Filled 2023-07-14: qty 5

## 2023-07-14 MED ORDER — LACTATED RINGERS IV SOLN: INTRAVENOUS | Status: DC

## 2023-07-14 MED ORDER — ROCURONIUM BROMIDE 10 MG/ML (PF) SYRINGE
PREFILLED_SYRINGE | INTRAVENOUS | Status: AC
  Filled 2023-07-14: qty 10

## 2023-07-14 MED ORDER — ONDANSETRON HCL 4 MG/2ML IJ SOLN
INTRAMUSCULAR | Status: AC
  Filled 2023-07-14: qty 2

## 2023-07-14 MED ORDER — ACETAMINOPHEN 500 MG PO TABS: 1000.0000 mg | ORAL_TABLET | Freq: Four times a day (QID) | ORAL | 0 refills | Status: AC

## 2023-07-14 MED ORDER — EPHEDRINE 5 MG/ML INJ
INTRAVENOUS | Status: AC
  Filled 2023-07-14: qty 5

## 2023-07-14 MED ORDER — DEXMEDETOMIDINE HCL IN NACL 80 MCG/20ML IV SOLN
INTRAVENOUS | Status: DC | PRN
Start: 2023-07-14 — End: 2023-07-14
  Administered 2023-07-14: 20 ug via INTRAVENOUS

## 2023-07-14 MED ORDER — DEXAMETHASONE SODIUM PHOSPHATE 10 MG/ML IJ SOLN
INTRAMUSCULAR | Status: DC | PRN
  Administered 2023-07-14: 10 mg via INTRAVENOUS

## 2023-07-14 MED ORDER — CHLORHEXIDINE GLUCONATE CLOTH 2 % EX PADS
6.0000 | MEDICATED_PAD | Freq: Once | CUTANEOUS | Status: AC
  Administered 2023-07-14: 6 via TOPICAL

## 2023-07-14 MED ORDER — PROPOFOL 10 MG/ML IV BOLUS
INTRAVENOUS | Status: DC | PRN
  Administered 2023-07-14: 50 mg via INTRAVENOUS
  Administered 2023-07-14: 150 mg via INTRAVENOUS

## 2023-07-14 MED ORDER — ROCURONIUM BROMIDE 100 MG/10ML IV SOLN
INTRAVENOUS | Status: DC | PRN
  Administered 2023-07-14: 70 mg via INTRAVENOUS
  Administered 2023-07-14: 30 mg via INTRAVENOUS

## 2023-07-14 MED ORDER — MIDAZOLAM HCL 2 MG/2ML IJ SOLN
INTRAMUSCULAR | Status: AC
  Filled 2023-07-14: qty 2

## 2023-07-14 MED ORDER — CHLORHEXIDINE GLUCONATE CLOTH 2 % EX PADS: 6.0000 | MEDICATED_PAD | Freq: Once | CUTANEOUS | Status: DC

## 2023-07-14 MED ORDER — FENTANYL CITRATE PF 50 MCG/ML IJ SOSY
25.0000 ug | PREFILLED_SYRINGE | INTRAMUSCULAR | Status: DC | PRN
  Administered 2023-07-14 (×3): 50 ug via INTRAVENOUS
  Filled 2023-07-14 (×3): qty 1

## 2023-07-14 MED ORDER — DEXAMETHASONE SODIUM PHOSPHATE 10 MG/ML IJ SOLN
INTRAMUSCULAR | Status: AC
  Filled 2023-07-14: qty 1

## 2023-07-14 MED ORDER — OXYCODONE HCL 5 MG PO TABS
5.0000 mg | ORAL_TABLET | Freq: Four times a day (QID) | ORAL | 0 refills | Status: AC | PRN
Start: 2023-07-14 — End: ?

## 2023-07-14 MED ORDER — KETOROLAC TROMETHAMINE 30 MG/ML IJ SOLN
INTRAMUSCULAR | Status: DC | PRN
  Administered 2023-07-14: 400 mg via INTRAVENOUS

## 2023-07-14 MED ORDER — ONDANSETRON HCL 4 MG/2ML IJ SOLN
INTRAMUSCULAR | Status: DC | PRN
  Administered 2023-07-14: 4 mg via INTRAVENOUS

## 2023-07-14 MED ORDER — ONDANSETRON HCL 4 MG/2ML IJ SOLN
4.0000 mg | Freq: Once | INTRAMUSCULAR | Status: DC | PRN
  Filled 2023-07-14: qty 2

## 2023-07-14 MED ORDER — LABETALOL HCL 5 MG/ML IV SOLN
INTRAVENOUS | Status: AC
  Filled 2023-07-14: qty 4

## 2023-07-14 MED ORDER — PHENYLEPHRINE 80 MCG/ML (10ML) SYRINGE FOR IV PUSH (FOR BLOOD PRESSURE SUPPORT)
PREFILLED_SYRINGE | INTRAVENOUS | Status: AC
  Filled 2023-07-14: qty 10

## 2023-07-14 MED ORDER — LIDOCAINE HCL (PF) 2 % IJ SOLN
INTRAMUSCULAR | Status: AC
  Filled 2023-07-14: qty 5

## 2023-07-14 MED ORDER — MIDAZOLAM HCL 5 MG/5ML IJ SOLN
INTRAMUSCULAR | Status: DC | PRN
  Administered 2023-07-14: 2 mg via INTRAVENOUS

## 2023-07-14 MED ORDER — LIDOCAINE HCL (CARDIAC) PF 100 MG/5ML IV SOSY
PREFILLED_SYRINGE | INTRAVENOUS | Status: DC | PRN
  Administered 2023-07-14: 50 mg via INTRAVENOUS

## 2023-07-14 MED ORDER — PROPOFOL 10 MG/ML IV BOLUS
INTRAVENOUS | Status: AC
  Filled 2023-07-14: qty 20

## 2023-07-14 MED ORDER — STERILE WATER FOR IRRIGATION IR SOLN
Status: DC | PRN
Start: 2023-07-14 — End: 2023-07-14
  Administered 2023-07-14: 500 mL

## 2023-07-14 MED ORDER — CHLORHEXIDINE GLUCONATE 0.12 % MT SOLN: 15.0000 mL | Freq: Once | OROMUCOSAL | Status: DC

## 2023-07-14 MED ORDER — ORAL CARE MOUTH RINSE: 15.0000 mL | Freq: Once | OROMUCOSAL | Status: DC

## 2023-07-14 MED ORDER — BUPIVACAINE HCL (PF) 0.5 % IJ SOLN
INTRAMUSCULAR | Status: AC
  Filled 2023-07-14: qty 30

## 2023-07-14 MED ORDER — LABETALOL HCL 5 MG/ML IV SOLN
INTRAVENOUS | Status: DC | PRN
Start: 2023-07-14 — End: 2023-07-14
  Administered 2023-07-14: 10 mg via INTRAVENOUS
  Administered 2023-07-14 (×2): 5 mg via INTRAVENOUS

## 2023-07-14 MED ORDER — CHLORHEXIDINE GLUCONATE 0.12 % MT SOLN
15.0000 mL | Freq: Once | OROMUCOSAL | Status: AC
  Administered 2023-07-14: 15 mL via OROMUCOSAL

## 2023-07-14 MED ORDER — FENTANYL CITRATE (PF) 100 MCG/2ML IJ SOLN
INTRAMUSCULAR | Status: DC | PRN
  Administered 2023-07-14: 100 ug via INTRAVENOUS
  Administered 2023-07-14: 50 ug via INTRAVENOUS
  Administered 2023-07-14 (×2): 100 ug via INTRAVENOUS
  Administered 2023-07-14: 50 ug via INTRAVENOUS
  Administered 2023-07-14: 100 ug via INTRAVENOUS

## 2023-07-14 SURGICAL SUPPLY — 51 items
ADH SKN CLS APL DERMABOND .7 (GAUZE/BANDAGES/DRESSINGS) ×1
APL PRP STRL LF DISP 70% ISPRP (MISCELLANEOUS) ×2
BLADE SURG 15 STRL LF DISP TIS (BLADE) ×1 IMPLANT
BLADE SURG 15 STRL SS (BLADE) ×1
CATH FOLEY 2WAY 5CC 16FR (CATHETERS) ×1
CATH URTH STD 16FR FL 2W DRN (CATHETERS) IMPLANT
CHLORAPREP W/TINT 26 (MISCELLANEOUS) ×1 IMPLANT
COVER LIGHT HANDLE STERIS (MISCELLANEOUS) ×2 IMPLANT
COVER MAYO STAND XLG (MISCELLANEOUS) ×1 IMPLANT
COVER TIP SHEARS 8 DVNC (MISCELLANEOUS) ×1 IMPLANT
DEFOGGER SCOPE WARMER CLEARIFY (MISCELLANEOUS) ×1 IMPLANT
DERMABOND ADVANCED .7 DNX12 (GAUZE/BANDAGES/DRESSINGS) ×1 IMPLANT
DRAPE ARM DVNC X/XI (DISPOSABLE) ×3 IMPLANT
DRAPE COLUMN DVNC XI (DISPOSABLE) ×1 IMPLANT
DRIVER NDL MEGA SUTCUT DVNCXI (INSTRUMENTS) ×1 IMPLANT
DRIVER NDLE MEGA SUTCUT DVNCXI (INSTRUMENTS) ×1
DRSG TEGADERM 4X4.75 (GAUZE/BANDAGES/DRESSINGS) ×1 IMPLANT
ELECT REM PT RETURN 9FT ADLT (ELECTROSURGICAL) ×1
ELECTRODE REM PT RTRN 9FT ADLT (ELECTROSURGICAL) ×1 IMPLANT
FORCEPS BPLR FENES DVNC XI (FORCEP) ×1 IMPLANT
GAUZE SPONGE 2X2 8PLY STRL LF (GAUZE/BANDAGES/DRESSINGS) ×2 IMPLANT
GLOVE BIOGEL PI IND STRL 6.5 (GLOVE) ×2 IMPLANT
GLOVE BIOGEL PI IND STRL 7.0 (GLOVE) ×2 IMPLANT
GLOVE SURG SS PI 6.5 STRL IVOR (GLOVE) ×2 IMPLANT
GOWN STRL REUS W/TWL LRG LVL3 (GOWN DISPOSABLE) ×3 IMPLANT
KIT TURNOVER KIT A (KITS) ×1 IMPLANT
MESH VENTRALIGHT ST 6X8 (Mesh Specialty) ×1 IMPLANT
MESH VENTRLGHT ELLIPSE 8X6XMFL (Mesh Specialty) IMPLANT
NDL HYPO 21X1.5 SAFETY (NEEDLE) ×1 IMPLANT
NDL INSUFFLATION 14GA 120MM (NEEDLE) ×1 IMPLANT
NEEDLE HYPO 21X1.5 SAFETY (NEEDLE) ×1
NEEDLE INSUFFLATION 14GA 120MM (NEEDLE) ×1
OBTURATOR OPTICAL STND 8 DVNC (TROCAR) ×1
OBTURATOR OPTICALSTD 8 DVNC (TROCAR) ×1 IMPLANT
PACK LAP CHOLE LZT030E (CUSTOM PROCEDURE TRAY) ×1 IMPLANT
PENCIL HANDSWITCHING (ELECTRODE) ×1 IMPLANT
POSITIONER HEAD 8X9X4 ADT (SOFTGOODS) ×1 IMPLANT
SCISSORS MNPLR CVD DVNC XI (INSTRUMENTS) ×1 IMPLANT
SEAL UNIV 5-12 XI (MISCELLANEOUS) ×3 IMPLANT
SET BASIN LINEN APH (SET/KITS/TRAYS/PACK) ×1 IMPLANT
SET TUBE DA VINCI INSUFFLATOR (TUBING) IMPLANT
SUT MNCRL AB 4-0 PS2 18 (SUTURE) ×1 IMPLANT
SUT STRATAFIX 0 PDS+ CT-2 23 (SUTURE) ×2
SUT V-LOC 90 ABS 3-0 VLT V-20 (SUTURE) ×2 IMPLANT
SUT V-LOC 90 ABS DVC 3-0 CL (SUTURE) IMPLANT
SUTURE STRATFX 0 PDS+ CT-2 23 (SUTURE) ×1 IMPLANT
SYR 30ML LL (SYRINGE) ×1 IMPLANT
TAPE TRANSPORE STRL 2 31045 (GAUZE/BANDAGES/DRESSINGS) ×1 IMPLANT
TRAY FOLEY W/BAG SLVR 16FR (SET/KITS/TRAYS/PACK) ×1
TRAY FOLEY W/BAG SLVR 16FR ST (SET/KITS/TRAYS/PACK) ×1 IMPLANT
WATER STERILE IRR 500ML POUR (IV SOLUTION) ×1 IMPLANT

## 2023-07-14 NOTE — Discharge Instructions (Signed)
Ambulatory Surgery Discharge Instructions  General Anesthesia or Sedation Do not drive or operate heavy machinery for 24 hours.  Do not consume alcohol, tranquilizers, sleeping medications, or any non-prescribed medications for 24 hours. Do not make important decisions or sign any important papers in the next 24 hours. You should have someone with you tonight at home.  Activity  You are advised to go directly home from the hospital.  Restrict your activities and rest for a day.  Resume light activity tomorrow. No heavy lifting over 10 lbs or strenuous exercise.  Fluids and Diet Begin with clear liquids, bouillon, dry toast, soda crackers.  If not nauseated, you may go to a regular diet when you desire.  Greasy and spicy foods are not advised.  Medications  If you have not had a bowel movement in 24 hours, take 2 tablespoons over the counter Milk of mag.             You May resume your blood thinners tomorrow (Aspirin, coumadin, or other).  You are being discharged with prescriptions for Opioid/Narcotic Medications: There are some specific considerations for these medications that you should know. Opioid Meds have risks & benefits. Addiction to these meds is always a concern with prolonged use Take medication only as directed Do not drive while taking narcotic pain medication Do not crush tablets or capsules Do not use a different container than medication was dispensed in Lock the container of medication in a cool, dry place out of reach of children and pets. Opioid medication can cause addiction Do not share with anyone else (this is a felony) Do not store medications for future use. Dispose of them properly.     Disposal:  Find a [employer] household drug take back site near you.  If you can't get to a drug take back site, use the recipe below as a last resort to dispose of expired, unused or unwanted drugs. Disposal  (Do not dispose chemotherapy drugs this way, talk to your  prescribing doctor instead.) Step 1: Mix drugs (do not crush) with dirt, kitty litter, or used coffee grounds and add a small amount of water to dissolve any solid medications. Step 2: Seal drugs in plastic bag. Step 3: Place plastic bag in trash. Step 4: Take prescription container and scratch out personal information, then recycle or throw away.  Operative Site  You have a liquid bandage over your incisions, this will begin to flake off in about a week. Ok to English as a second language teacher. Keep wound clean and dry. No baths or swimming. No lifting more than 10 pounds.  Contact Information: If you have questions or concerns, please call our office, [phone], Monday- Thursday 8AM-5PM and Friday 8AM-12Noon.  If it is after hours or on the weekend, please call [facility] Main Number, [phone], and ask to speak to the surgeon on call for Dr. Robyne Peers at [facility].   SPECIFIC COMPLICATIONS TO WATCH FOR: Inability to urinate Fever over 101? F by mouth Nausea and vomiting lasting longer than 24 hours. Pain not relieved by medication ordered Swelling around the operative site Increased redness, warmth, hardness, around operative area Numbness, tingling, or cold fingers or toes Blood -soaked dressing, (small amounts of oozing may be normal) Increasing and progressive drainage from surgical area or exam site

## 2023-07-14 NOTE — Anesthesia Preprocedure Evaluation (Signed)
Anesthesia Evaluation  Patient identified by MRN, date of birth, ID band Patient awake    Reviewed: Allergy & Precautions, H&P , NPO status , Patient's Chart, lab work & pertinent test results, reviewed documented beta blocker date and time   Airway Mallampati: II  TM Distance: >3 FB Neck ROM: full    Dental no notable dental hx.    Pulmonary neg pulmonary ROS, sleep apnea    Pulmonary exam normal breath sounds clear to auscultation       Cardiovascular Exercise Tolerance: Good hypertension, negative cardio ROS  Rhythm:regular Rate:Normal     Neuro/Psych  Headaches negative neurological ROS  negative psych ROS   GI/Hepatic negative GI ROS, Neg liver ROS,,,  Endo/Other  negative endocrine ROS    Renal/GU negative Renal ROS  negative genitourinary   Musculoskeletal   Abdominal   Peds  Hematology negative hematology ROS (+) Blood dyscrasia, anemia   Anesthesia Other Findings   Reproductive/Obstetrics negative OB ROS                             Anesthesia Physical Anesthesia Plan  ASA: 2  Anesthesia Plan: General   Post-op Pain Management:    Induction:   PONV Risk Score and Plan: Propofol infusion  Airway Management Planned:   Additional Equipment:   Intra-op Plan:   Post-operative Plan:   Informed Consent: I have reviewed the patients History and Physical, chart, labs and discussed the procedure including the risks, benefits and alternatives for the proposed anesthesia with the patient or authorized representative who has indicated his/her understanding and acceptance.     Dental Advisory Given  Plan Discussed with: CRNA  Anesthesia Plan Comments:        Anesthesia Quick Evaluation

## 2023-07-14 NOTE — Progress Notes (Signed)
[facility]  Spoke with the patient's family in the consultation room.  I explained that she tolerated the procedure without difficulty.  She has dissolvable stitches under the skin with overlying skin glue.  This will flake off in 10 to 14 days.  I discharged her home with a prescription for narcotic pain medication that they should take as needed for pain.  I also want her taking scheduled Tylenol.  If they take the narcotic pain medication, they should take a stool softener as well.  The patient will follow-up with me in 2 weeks.  All questions were answered to their expressed satisfaction.  Theophilus Kinds, DO [facility] [address] Vella Raring [place], [place] [address] [phone] (office)

## 2023-07-14 NOTE — Anesthesia Procedure Notes (Signed)
Procedure Name: Intubation Date/Time: 07/14/2023 8:12 AM  Performed by: Shanon Payor, CRNAPre-anesthesia Checklist: Patient identified, Emergency Drugs available, Suction available, Patient being monitored and Timeout performed Patient Re-evaluated:Patient Re-evaluated prior to induction Oxygen Delivery Method: Circle system utilized Preoxygenation: Pre-oxygenation with 100% oxygen Induction Type: IV induction Ventilation: Mask ventilation without difficulty Laryngoscope Size: Mac and 3 Grade View: Grade I Tube type: Oral Tube size: 7.0 mm Number of attempts: 1 Airway Equipment and Method: Stylet Placement Confirmation: ETT inserted through vocal cords under direct vision, positive ETCO2, CO2 detector and breath sounds checked- equal and bilateral Secured at: 22 cm Tube secured with: Tape Dental Injury: Teeth and Oropharynx as per pre-operative assessment

## 2023-07-14 NOTE — Op Note (Signed)
[facility] Operative Note  07/14/23  Preoperative Diagnosis: Ventral hernia   Postoperative Diagnosis: Ventral hernia x 3, intra-abdominal adhesions   Procedure(s) Performed: Robotic assisted laparoscopic ventral hernia x 3 with mesh, defect size 9.5 cm total (superiormost hernia 5 x 5 cm, middle hernia 2 cm, inferior most hernia 2.5 cm), lysis of adhesions   Surgeon: Theophilus Kinds, DO   Assistants: No qualified resident was available    Anesthesia: General endotracheal   Anesthesiologist: Dr. Johnnette Litter   Specimens: None   Estimated Blood Loss: Minimal   Blood Replacement: None    Complications: None   Wound Class: Clean   Operative Indications: Patient is a 57 year old female who presents for ventral hernia repair.  She has been having pain associated with her hernia and wants it repaired.  She was initially scheduled for surgery in April, but this had to be postponed secondary to uncontrolled hypertension.  She has subsequently been started on blood pressure medications and been cleared for surgery from a cardiology standpoint.  She is agreeable to surgical intervention.  All risks and benefits of performing this procedure were discussed with the patient including pain, infection, bleeding, damage to the surrounding structures, hernia recurrence and need for more procedures or surgery. The patient voiced understanding of the procedure, all questions were sought and answered, and consent was obtained.  Findings: Intra-abdominal adhesions noted to the patient's ventral hernias, with superiormost ventral hernia containing transverse colon Defect size 9.5 cm total (superiormost hernia 5 x 5 cm, middle hernia 2 cm, inferior most hernia 2.5 cm) Tension free repair achieved with 6 x 8 inch mesh and suture Adequate hemostasis    Procedure: The patient was brought to the operating room and general anesthesia was induced. A time-out was completed verifying correct  patient, procedure, site, positioning, and implant(s) and/or special equipment prior to beginning this procedure. Antibiotics were administered prior to making the incision. SCDs placed. The anterior abdominal wall was prepped and draped in the standard sterile fashion.   A small stab incision was made at Palmer's point.  A Veress needle placed and the saline drop test was performed. The abdomen was insufflated to 15cm without issues.  A infraumbilical site was used and a stab incision was made.  An 8 mm port was then placed through this incision site using the optiview ports.  Two additional 8mm ports were placed on either side of the infraumbilical port (within the right and left lower quadrants) with adequate space in between. The [place] robot then docked into place.  There was a large superior ventral hernia containing bowel and transverse colon and then 2 additional more inferior hernias also containing bowel.  There were some intra-abdominal adhesions to the anterior abdominal wall which were taken down using electrocautery. The hernia contents noted and reduced with combination of blunt, sharp dissection with scissors and fenestrated forceps.  Hemostasis achieved throughout this portion.  Once all hernia contents reduced, there was noted to be 3 separate hernias with a total defect size of 9.5 cm hernia defect (superiormost hernia 5 x 5 cm, middle hernia 2 cm, inferior most hernia 2.5 cm).  Any fat overlying the peritoneal layer was taken down creating flaps to clear the space for adequate fixation of the mesh.   The 6 x 8 inch mesh and two 0 stratafix sutures and 4 V-Loc sutures were placed in the abdominal cavity under direct visualization.  A transfacial suture with 0 stratafix used to primarily close defects under minimal tension.  The stratafix was then passed through the center of the mesh (coated side out) to secured the mesh to the abdominal wall centered over the defect.  The mesh was then  circumferentially sutured into the anterior abdominal wall using 3-0 V-Loc x4.  Any bleeding noted during this portion was no longer actively bleeding by end of securing mesh and tightening the suture.    The robot was undocked.  The abdomen was then de-sufflated and the ports were removed.  All skin incisions closed with subcuticular 4-0 Monocryl and dermabond. Final inspection revealed acceptable hemostasis.   All counts were correct at the end of the case. The patient was awakened from anesthesia and extubated without complication.  The patient went to the PACU in stable condition.   Theophilus Kinds, DO [facility] [address] [place], [place] [address] [phone] (office)

## 2023-07-14 NOTE — Transfer of Care (Signed)
Immediate Anesthesia Transfer of Care Note  Patient: Sylvia Fisher  Procedure(s) Performed: XI ROBOTIC ASSISTED VENTRAL HERNIA W/ MESH X3 (Abdomen)  Patient Location: PACU  Anesthesia Type:General  Level of Consciousness: awake, drowsy, and patient cooperative  Airway & Oxygen Therapy: Patient Spontanous Breathing and Patient connected to face mask oxygen  Post-op Assessment: Report given to RN, Post -op Vital signs reviewed and stable, and Patient moving all extremities X 4  Post vital signs: Reviewed and stable  Last Vitals:  Vitals Value Taken Time  BP 186/99 07/14/23 1142  Temp    Pulse 64 07/14/23 1144  Resp 9 07/14/23 1144  SpO2 99 % 07/14/23 1144  Vitals shown include unfiled device data.  Last Pain:  Vitals:   07/14/23 0600  TempSrc: Oral  PainSc: 0-No pain         Complications: No notable events documented.

## 2023-07-14 NOTE — Interval H&P Note (Signed)
History and Physical Interval Note:  07/14/2023 7:50 AM  Sylvia Fisher  has presented today for surgery, with the diagnosis of VENTRAL HERNIA, 4 CM.  The various methods of treatment have been discussed with the patient and family. After consideration of risks, benefits and other options for treatment, the patient has consented to  Procedure(s): XI ROBOTIC ASSISTED VENTRAL HERNIA W/ MESH (N/A) as a surgical intervention.  The patient's history has been reviewed, patient examined, no change in status, stable for surgery.  I have reviewed the patient's chart and labs.  Questions were answered to the patient's satisfaction.     Gedalia Mcmillon A Treina Arscott

## 2023-07-16 NOTE — Anesthesia Postprocedure Evaluation (Signed)
Anesthesia Post Note  Patient: Sylvia Fisher  Procedure(s) Performed: XI ROBOTIC ASSISTED VENTRAL HERNIA W/ MESH X3 (Abdomen)  Patient location during evaluation: Phase II Anesthesia Type: General Level of consciousness: awake Pain management: pain level controlled Vital Signs Assessment: post-procedure vital signs reviewed and stable Respiratory status: spontaneous breathing and respiratory function stable Cardiovascular status: blood pressure returned to baseline and stable Postop Assessment: no headache and no apparent nausea or vomiting Anesthetic complications: no Comments: Late entry   No notable events documented.   Last Vitals:  Vitals:   07/14/23 1230 07/14/23 1244  BP: (!) 160/86 (!) 165/97  Pulse: 72 68  Resp: 13   Temp:  36.6 C  SpO2: 99% 95%    Last Pain:  Vitals:   07/14/23 1244  TempSrc: Oral  PainSc: 8                  Windell Norfolk

## 2023-07-19 ENCOUNTER — Encounter (HOSPITAL_COMMUNITY): Payer: Self-pay | Admitting: Surgery

## 2023-07-28 ENCOUNTER — Ambulatory Visit: Payer: Medicaid Other | Admitting: Surgery

## 2023-07-28 ENCOUNTER — Encounter: Payer: Self-pay | Admitting: Surgery

## 2023-07-28 VITALS — BP 136/84 | HR 77 | Temp 98.3°F | Resp 12 | Ht 65.0 in | Wt 201.0 lb

## 2023-07-28 DIAGNOSIS — Z09 Encounter for follow-up examination after completed treatment for conditions other than malignant neoplasm: Secondary | ICD-10-CM

## 2023-07-28 NOTE — Progress Notes (Signed)
[facility] Note   HPI:  57 y.o. Female presents to clinic for post-op follow-up status post robotic assisted laparoscopic ventral hernia x 3 repair with mesh on 9/26.  She has overall been doing well since the surgery.  Her pain is well-controlled, she is tolerating a diet, and she is moving her bowels without issue.  She denies any issues at her incision sites.  Her only complaint is that she has a knot where the previous hernia was located.  Denies fevers and chills.  Review of Systems:  All other review of systems: otherwise negative   Vital Signs:  BP 136/84   Pulse 77   Temp 98.3 F (36.8 C) (Oral)   Resp 12   Ht 5\' 5"  (1.651 m)   Wt 201 lb (91.2 kg)   LMP 11/19/2015 (Approximate)   SpO2 97%   BMI 33.45 kg/m    Physical Exam:  Physical Exam Vitals reviewed.  Constitutional:      Appearance: Normal appearance.  Abdominal:     Comments: Abdomen soft, nondistended, no percussion tenderness, nontender palpation; no rigidity, guarding, rebound tenderness; laparoscopic incision sites healing well with skin glue in place, mild fullness at previous hernia site, likely seroma  Neurological:     Mental Status: She is alert.     Laboratory studies: None  Imaging:  None  Assessment:  57 y.o. yo Female who presents for follow-up status post robotic assisted laparoscopic ventral hernia repair x 3 with mesh  Plan:  -Patient overall doing well since the surgery.  She is tolerating a diet, moving her bowels, and pain is well-controlled -Advised that the fullness at her previous hernia site is likely a seroma.  This will improve and be reabsorbed by her body over time -Advised that she needs to continue to take it easy for the next 2 to 4 weeks for total of 4 to 6 weeks after surgery -Also advised that she can use antibiotic ointment prior to showering to help loosen the remaining skin glue -Follow up as needed  All of the above recommendations were discussed  with the patient, and all of patient's questions were answered to her expressed satisfaction.  Theophilus Kinds, DO [facility] [address] [place], [place] [address] [phone] (office)

## 2024-01-02 ENCOUNTER — Other Ambulatory Visit: Payer: Self-pay

## 2024-01-02 ENCOUNTER — Emergency Department (HOSPITAL_COMMUNITY)
Admission: EM | Admit: 2024-01-02 | Discharge: 2024-01-02 | Disposition: A | Discharge status: Home / Self Care | Attending: Emergency Medicine | Admitting: Emergency Medicine

## 2024-01-02 ENCOUNTER — Encounter (HOSPITAL_COMMUNITY): Payer: Self-pay

## 2024-01-02 DIAGNOSIS — S29012A Strain of muscle and tendon of back wall of thorax, initial encounter: Secondary | ICD-10-CM | POA: Insufficient documentation

## 2024-01-02 DIAGNOSIS — S29019A Strain of muscle and tendon of unspecified wall of thorax, initial encounter: Secondary | ICD-10-CM

## 2024-01-02 DIAGNOSIS — S299XXA Unspecified injury of thorax, initial encounter: Secondary | ICD-10-CM | POA: Diagnosis present

## 2024-01-02 DIAGNOSIS — I10 Essential (primary) hypertension: Secondary | ICD-10-CM | POA: Insufficient documentation

## 2024-01-02 DIAGNOSIS — Y92410 Unspecified street and highway as the place of occurrence of the external cause: Secondary | ICD-10-CM | POA: Insufficient documentation

## 2024-01-02 DIAGNOSIS — R200 Anesthesia of skin: Secondary | ICD-10-CM | POA: Diagnosis not present

## 2024-01-02 MED ORDER — METHOCARBAMOL 500 MG PO TABS: 500.0000 mg | ORAL_TABLET | Freq: Two times a day (BID) | ORAL | 0 refills | Status: DC | PRN

## 2024-01-02 MED ORDER — NAPROXEN 500 MG PO TABS: 500.0000 mg | ORAL_TABLET | Freq: Two times a day (BID) | ORAL | 0 refills | Status: DC
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
## 2024-01-02 NOTE — ED Notes (Signed)
 ED Provider at bedside.

## 2024-01-02 NOTE — Discharge Instructions (Signed)
  Please take Naprosyn, 500mg  by mouth twice daily as needed for pain - this in an antiinflammatory medicine (NSAID) and is similar to ibuprofen - many people feel that it is stronger than ibuprofen and it is easier to take since it is a smaller pill.  Please use this only for 1 week - if your pain persists, you will need to follow up with your doctor in the office for ongoing guidance and pain control.  Please take Robaxin, 500 mg up to 2 or 3 times a day as needed for muscle spasm, this is a muscle relaxer, it may cause generalized weakness, sleepiness and you should not drive or do important things while taking this medication.  This includes driving a vehicle or taking care of young children, these things should not be done while taking this medication.    Thank you for allowing [place] to treat you in the emergency department today.  After reviewing your examination and potential testing that was done it appears that you are safe to go home.  I would like for you to follow-up with your doctor within the next several days, have them obtain your records and follow-up with them to review all potential tests and results from your visit.  If you should develop severe or worsening symptoms return to the emergency department immediately

## 2024-01-13 ENCOUNTER — Other Ambulatory Visit: Payer: Self-pay | Admitting: Family Medicine

## 2024-01-13 ENCOUNTER — Ambulatory Visit
Admission: RE | Admit: 2024-01-13 | Discharge: 2024-01-13 | Disposition: A | Discharge status: Home / Self Care | Source: Ambulatory Visit | Attending: Family Medicine | Admitting: Family Medicine

## 2024-01-13 ENCOUNTER — Inpatient Hospital Stay: Admission: RE | Admit: 2024-01-13 | Source: Ambulatory Visit

## 2024-01-13 DIAGNOSIS — M25511 Pain in right shoulder: Secondary | ICD-10-CM

## 2024-01-13 DIAGNOSIS — M546 Pain in thoracic spine: Secondary | ICD-10-CM

## 2024-11-08 ENCOUNTER — Emergency Department (HOSPITAL_COMMUNITY)

## 2024-11-08 ENCOUNTER — Encounter (HOSPITAL_COMMUNITY): Payer: Self-pay | Admitting: *Deleted

## 2024-11-08 ENCOUNTER — Emergency Department (HOSPITAL_COMMUNITY): Admission: EM | Admit: 2024-11-08 | Discharge: 2024-11-08 | Disposition: A | Discharge status: Home / Self Care

## 2024-11-08 ENCOUNTER — Other Ambulatory Visit: Payer: Self-pay

## 2024-11-08 DIAGNOSIS — Y92410 Unspecified street and highway as the place of occurrence of the external cause: Secondary | ICD-10-CM | POA: Insufficient documentation

## 2024-11-08 DIAGNOSIS — S39012A Strain of muscle, fascia and tendon of lower back, initial encounter: Secondary | ICD-10-CM

## 2024-11-08 DIAGNOSIS — M545 Low back pain, unspecified: Secondary | ICD-10-CM | POA: Insufficient documentation

## 2024-11-08 DIAGNOSIS — M546 Pain in thoracic spine: Secondary | ICD-10-CM | POA: Insufficient documentation

## 2024-11-08 DIAGNOSIS — S161XXA Strain of muscle, fascia and tendon at neck level, initial encounter: Secondary | ICD-10-CM

## 2024-11-08 DIAGNOSIS — M542 Cervicalgia: Secondary | ICD-10-CM | POA: Insufficient documentation

## 2024-11-08 MED ORDER — METHOCARBAMOL 500 MG PO TABS: 500.0000 mg | ORAL_TABLET | Freq: Two times a day (BID) | ORAL | 0 refills | Status: AC

## 2024-11-08 MED ORDER — NAPROXEN 375 MG PO TABS: 375.0000 mg | ORAL_TABLET | Freq: Two times a day (BID) | ORAL | 0 refills | Status: AC

## 2024-11-08 NOTE — Discharge Instructions (Signed)
 Please follow-up closely with your primary care doctor on an outpatient basis.  Return to emergency department immediately for any new or worsening symptoms.
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
## 2024-11-08 NOTE — ED Notes (Signed)
 Pt in radiology

## 2024-11-08 NOTE — ED Notes (Signed)
 ED Provider at bedside.
